# Patient Record
Sex: Female | Born: 1990 | Race: White | Hispanic: No | Marital: Single
Health system: Midwestern US, Academic
[De-identification: ages and names within clinical notes are randomized; demographics above are authoritative.]

---

## 2017-04-05 ENCOUNTER — Encounter: Admit: 2017-04-05 | Discharge: 2017-04-05 | Payer: BC Managed Care – PPO

## 2017-04-05 ENCOUNTER — Ambulatory Visit: Admit: 2017-04-05 | Discharge: 2017-04-06 | Payer: BC Managed Care – PPO

## 2017-04-05 DIAGNOSIS — F419 Anxiety disorder, unspecified: Principal | ICD-10-CM

## 2017-04-05 DIAGNOSIS — Z363 Encounter for antenatal screening for malformations: Principal | ICD-10-CM

## 2017-04-05 DIAGNOSIS — Z9089 Acquired absence of other organs: ICD-10-CM

## 2017-04-05 DIAGNOSIS — M0800 Unspecified juvenile rheumatoid arthritis of unspecified site: ICD-10-CM

## 2017-04-05 DIAGNOSIS — B009 Herpesviral infection, unspecified: ICD-10-CM

## 2017-04-05 DIAGNOSIS — Z1379 Encounter for other screening for genetic and chromosomal anomalies: ICD-10-CM

## 2017-04-05 DIAGNOSIS — O0993 Supervision of high risk pregnancy, unspecified, third trimester: Principal | ICD-10-CM

## 2017-04-05 DIAGNOSIS — G43909 Migraine, unspecified, not intractable, without status migrainosus: ICD-10-CM

## 2017-04-05 DIAGNOSIS — K319 Disease of stomach and duodenum, unspecified: ICD-10-CM

## 2017-04-09 ENCOUNTER — Encounter: Admit: 2017-04-09 | Discharge: 2017-04-09 | Payer: BC Managed Care – PPO

## 2017-04-17 ENCOUNTER — Ambulatory Visit: Admit: 2017-04-17 | Discharge: 2017-04-18 | Payer: BC Managed Care – PPO

## 2017-04-17 ENCOUNTER — Encounter: Admit: 2017-04-17 | Discharge: 2017-04-17 | Payer: BC Managed Care – PPO

## 2017-04-17 DIAGNOSIS — K319 Disease of stomach and duodenum, unspecified: ICD-10-CM

## 2017-04-17 DIAGNOSIS — F419 Anxiety disorder, unspecified: Principal | ICD-10-CM

## 2017-04-17 DIAGNOSIS — O9989 Other specified diseases and conditions complicating pregnancy, childbirth and the puerperium: ICD-10-CM

## 2017-04-17 DIAGNOSIS — O09893 Supervision of other high risk pregnancies, third trimester: ICD-10-CM

## 2017-04-17 DIAGNOSIS — M0800 Unspecified juvenile rheumatoid arthritis of unspecified site: ICD-10-CM

## 2017-04-17 DIAGNOSIS — O283 Abnormal ultrasonic finding on antenatal screening of mother: Principal | ICD-10-CM

## 2017-04-17 DIAGNOSIS — Z315 Encounter for genetic counseling: ICD-10-CM

## 2017-04-17 DIAGNOSIS — B009 Herpesviral infection, unspecified: ICD-10-CM

## 2017-04-17 DIAGNOSIS — Z9089 Acquired absence of other organs: ICD-10-CM

## 2017-04-17 DIAGNOSIS — G43909 Migraine, unspecified, not intractable, without status migrainosus: ICD-10-CM

## 2017-04-17 DIAGNOSIS — O0993 Supervision of high risk pregnancy, unspecified, third trimester: Principal | ICD-10-CM

## 2017-04-17 MED ORDER — VALACYCLOVIR 500 MG PO TAB
500 mg | ORAL_TABLET | Freq: Two times a day (BID) | ORAL | 0 refills | Status: AC
Start: 2017-04-17 — End: 2019-10-28

## 2017-04-19 ENCOUNTER — Encounter: Admit: 2017-04-19 | Discharge: 2017-04-19 | Payer: BC Managed Care – PPO

## 2017-04-24 ENCOUNTER — Encounter: Admit: 2017-04-24 | Discharge: 2017-04-24 | Payer: BC Managed Care – PPO

## 2017-04-24 DIAGNOSIS — Z1379 Encounter for other screening for genetic and chromosomal anomalies: Principal | ICD-10-CM

## 2017-04-26 LAB — SYPHILIS AB SCREEN

## 2017-04-27 ENCOUNTER — Encounter: Admit: 2017-04-27 | Discharge: 2017-04-27 | Payer: BC Managed Care – PPO

## 2017-05-01 ENCOUNTER — Ambulatory Visit: Admit: 2017-05-01 | Discharge: 2017-05-02 | Payer: BC Managed Care – PPO

## 2017-05-01 DIAGNOSIS — O359XX0 Maternal care for (suspected) fetal abnormality and damage, unspecified, not applicable or unspecified: Principal | ICD-10-CM

## 2017-05-01 DIAGNOSIS — O0993 Supervision of high risk pregnancy, unspecified, third trimester: ICD-10-CM

## 2017-05-08 ENCOUNTER — Ambulatory Visit: Admit: 2017-05-08 | Discharge: 2017-05-09 | Payer: BC Managed Care – PPO

## 2017-05-08 ENCOUNTER — Encounter: Admit: 2017-05-08 | Discharge: 2017-05-08 | Payer: BC Managed Care – PPO

## 2017-05-08 ENCOUNTER — Ambulatory Visit: Admit: 2017-05-08 | Discharge: 2017-05-08 | Payer: BC Managed Care – PPO

## 2017-05-08 ENCOUNTER — Inpatient Hospital Stay
Admit: 2017-05-08 | Discharge: 2017-05-13 | Disposition: A | Discharge status: Home / Self Care | Payer: BC Managed Care – PPO | Attending: Maternal & Fetal Medicine

## 2017-05-08 ENCOUNTER — Observation Stay: Admit: 2017-05-08 | Discharge: 2017-05-08 | Payer: BC Managed Care – PPO

## 2017-05-08 DIAGNOSIS — M0800 Unspecified juvenile rheumatoid arthritis of unspecified site: ICD-10-CM

## 2017-05-08 DIAGNOSIS — G43909 Migraine, unspecified, not intractable, without status migrainosus: ICD-10-CM

## 2017-05-08 DIAGNOSIS — O0993 Supervision of high risk pregnancy, unspecified, third trimester: ICD-10-CM

## 2017-05-08 DIAGNOSIS — F419 Anxiety disorder, unspecified: Principal | ICD-10-CM

## 2017-05-08 DIAGNOSIS — K319 Disease of stomach and duodenum, unspecified: ICD-10-CM

## 2017-05-08 DIAGNOSIS — B009 Herpesviral infection, unspecified: ICD-10-CM

## 2017-05-08 DIAGNOSIS — Z9089 Acquired absence of other organs: ICD-10-CM

## 2017-05-08 DIAGNOSIS — O09893 Supervision of other high risk pregnancies, third trimester: ICD-10-CM

## 2017-05-08 LAB — URINALYSIS DIPSTICK
Lab: NEGATIVE (ref 3–12)
Lab: NEGATIVE U/L (ref 7–40)
Lab: NEGATIVE mL/min
Lab: POSITIVE mL/min — AB

## 2017-05-08 LAB — URINALYSIS, MICROSCOPIC

## 2017-05-08 LAB — COMPREHENSIVE METABOLIC PANEL
Lab: 131 U/L — ABNORMAL HIGH (ref 25–110)
Lab: 135 MMOL/L — ABNORMAL LOW (ref 137–147)
Lab: 21 MMOL/L (ref 21–30)

## 2017-05-08 LAB — CBC: Lab: 13 10*3/uL — ABNORMAL HIGH (ref 4.5–11.0)

## 2017-05-08 LAB — PROTEIN/CR RATIO,UR RAN: Lab: 26 mg/dL — ABNORMAL LOW (ref 36–45)

## 2017-05-08 LAB — LDH-LACTATE DEHYDROGENASE: Lab: 175 U/L (ref 100–210)

## 2017-05-08 LAB — URIC ACID: Lab: 5.2 mg/dL (ref 2.0–7.0)

## 2017-05-08 MED ORDER — LACTATED RINGERS IV SOLP
INTRAVENOUS | 0 refills | Status: DC
Start: 2017-05-08 — End: 2017-05-08

## 2017-05-08 MED ORDER — VALACYCLOVIR 500 MG PO TAB
500 mg | Freq: Two times a day (BID) | ORAL | 0 refills | Status: DC
Start: 2017-05-08 — End: 2017-05-10
  Administered 2017-05-09 – 2017-05-10 (×4): 500 mg via ORAL

## 2017-05-09 ENCOUNTER — Encounter: Admit: 2017-05-09 | Discharge: 2017-05-10 | Payer: BC Managed Care – PPO

## 2017-05-09 ENCOUNTER — Encounter: Admit: 2017-05-09 | Discharge: 2017-05-09 | Payer: BC Managed Care – PPO

## 2017-05-09 DIAGNOSIS — O321XX0 Maternal care for breech presentation, not applicable or unspecified: Principal | ICD-10-CM

## 2017-05-09 DIAGNOSIS — O0993 Supervision of high risk pregnancy, unspecified, third trimester: ICD-10-CM

## 2017-05-09 LAB — COMPREHENSIVE METABOLIC PANEL
Lab: 0.4 mg/dL (ref 0.3–1.2)
Lab: 0.7 mg/dL (ref 0.4–1.00)
Lab: 126 U/L — ABNORMAL HIGH (ref 25–110)
Lab: 138 MMOL/L — ABNORMAL LOW (ref 137–147)
Lab: 14 U/L (ref 7–40)
Lab: 21 MMOL/L (ref 21–30)
Lab: 3.4 g/dL — ABNORMAL LOW (ref 3.5–5.0)
Lab: 3.6 MMOL/L — ABNORMAL LOW (ref 3.5–5.1)
Lab: 5 U/L — ABNORMAL LOW (ref 7–56)
Lab: 6.2 g/dL — ABNORMAL HIGH (ref 6.0–8.0)
Lab: 8 (ref 3–12)
Lab: 8.9 mg/dL (ref 8.5–10.6)
Lab: >60 mL/min (ref >60)
Lab: >60 mL/min (ref >60)

## 2017-05-09 LAB — CULTURE-URINE W/SENSITIVITY

## 2017-05-09 LAB — URIC ACID: Lab: 5.1 mg/dL — ABNORMAL LOW (ref 2.0–7.0)

## 2017-05-09 LAB — CBC
Lab: 31 pg (ref 26–34)
Lab: 91 FL — ABNORMAL HIGH (ref 80–100)

## 2017-05-09 LAB — TOTAL PROTEIN-URINE 24 HR: Lab: 34 mg/dL

## 2017-05-09 LAB — URINE COLLECTION
Lab: 234 mL
Lab: 24

## 2017-05-09 MED ORDER — ACETAMINOPHEN 325 MG PO TAB
650 mg | ORAL | 0 refills | Status: DC | PRN
Start: 2017-05-09 — End: 2017-05-10
  Administered 2017-05-09: 20:00:00 650 mg via ORAL

## 2017-05-09 MED ORDER — LACTATED RINGERS IV SOLP
INTRAVENOUS | 0 refills | Status: DC
Start: 2017-05-09 — End: 2017-05-10
  Administered 2017-05-10 (×2): 1000.000 mL via INTRAVENOUS

## 2017-05-10 ENCOUNTER — Encounter: Admit: 2017-05-10 | Discharge: 2017-05-10 | Payer: BC Managed Care – PPO

## 2017-05-10 ENCOUNTER — Inpatient Hospital Stay: Admit: 2017-05-10 | Discharge: 2017-05-10 | Payer: BC Managed Care – PPO

## 2017-05-10 DIAGNOSIS — Z9089 Acquired absence of other organs: ICD-10-CM

## 2017-05-10 DIAGNOSIS — G43909 Migraine, unspecified, not intractable, without status migrainosus: ICD-10-CM

## 2017-05-10 DIAGNOSIS — F419 Anxiety disorder, unspecified: Principal | ICD-10-CM

## 2017-05-10 DIAGNOSIS — K319 Disease of stomach and duodenum, unspecified: ICD-10-CM

## 2017-05-10 DIAGNOSIS — B009 Herpesviral infection, unspecified: ICD-10-CM

## 2017-05-10 DIAGNOSIS — M0800 Unspecified juvenile rheumatoid arthritis of unspecified site: ICD-10-CM

## 2017-05-10 LAB — BLOOD GASES-CORD BLOOD
Lab: 19 mmHg
Lab: 25 mmHg
Lab: 27 %
Lab: 4.2 MMOL/L
Lab: 41 mmHg
Lab: 59 mmHg
Lab: 7.2
Lab: 7.3

## 2017-05-10 LAB — URIC ACID: Lab: 4.7 mg/dL (ref 2.0–7.0)

## 2017-05-10 LAB — CREATININE CLEARANCE-URINE 24H
Lab: 0.7 mg/dL
Lab: 143 mL/min — ABNORMAL HIGH (ref 88–128)
Lab: 63 mg/dL — ABNORMAL HIGH (ref 50–150)

## 2017-05-10 MED ORDER — ONDANSETRON HCL (PF) 4 MG/2 ML IJ SOLN
4 mg | Freq: Once | INTRAVENOUS | 0 refills | Status: AC | PRN
Start: 2017-05-10 — End: ?

## 2017-05-10 MED ORDER — LACTATED RINGERS IV SOLP
INTRAVENOUS | 0 refills | Status: AC
Start: 2017-05-10 — End: ?
  Administered 2017-05-10 – 2017-05-11 (×2): 1000.000 mL via INTRAVENOUS

## 2017-05-10 MED ORDER — DIPHENHYDRAMINE HCL 50 MG/ML IJ SOLN
25 mg | Freq: Once | INTRAVENOUS | 0 refills | Status: AC | PRN
Start: 2017-05-10 — End: ?

## 2017-05-10 MED ORDER — PRENATAL VIT-IRON FUM-FOLIC AC 28 MG IRON- 800 MCG PO TAB
1 | Freq: Every evening | ORAL | 0 refills | Status: DC
Start: 2017-05-10 — End: 2017-05-13
  Administered 2017-05-11 – 2017-05-13 (×3): 1 via ORAL

## 2017-05-10 MED ORDER — SODIUM CITRATE-CITRIC ACID 500-334 MG/5 ML PO SOLN
ORAL | 0 refills | Status: DC
Start: 2017-05-10 — End: 2017-05-10
  Administered 2017-05-10: 15:00:00 30 mL via ORAL

## 2017-05-10 MED ORDER — OXYTOCIN IN 0.9 % SOD CHLORIDE 30 UNIT/500 ML IV SOLN (OR)
0 refills | Status: DC
Start: 2017-05-10 — End: 2017-05-10
  Administered 2017-05-10: 16:00:00 via INTRAVENOUS

## 2017-05-10 MED ORDER — PHENYLEPHRINE IN 0.9% NACL(PF) 1 MG/10 ML (100 MCG/ML) IV SYRG
0 refills | Status: DC
Start: 2017-05-10 — End: 2017-05-10
  Administered 2017-05-10 (×6): 100 ug via INTRAVENOUS

## 2017-05-10 MED ORDER — CEFAZOLIN 1 GRAM IJ SOLR
INTRAVENOUS | 0 refills | Status: DC
Start: 2017-05-10 — End: 2017-05-10
  Administered 2017-05-10: 15:00:00 2 g via INTRAVENOUS

## 2017-05-10 MED ORDER — PROMETHAZINE 25 MG/ML IJ SOLN
6.25 mg | INTRAVENOUS | 0 refills | Status: DC | PRN
Start: 2017-05-10 — End: 2017-05-13

## 2017-05-10 MED ORDER — ONDANSETRON HCL (PF) 4 MG/2 ML IJ SOLN
4 mg | INTRAVENOUS | 0 refills | Status: DC | PRN
Start: 2017-05-10 — End: 2017-05-13

## 2017-05-10 MED ORDER — MAGNESIUM HYDROXIDE 2,400 MG/10 ML PO SUSP
10 mL | ORAL | 0 refills | Status: DC | PRN
Start: 2017-05-10 — End: 2017-05-13
  Administered 2017-05-11: 15:00:00 10 mL via ORAL

## 2017-05-10 MED ORDER — SIMETHICONE 80 MG PO CHEW
80 mg | ORAL | 0 refills | Status: DC | PRN
Start: 2017-05-10 — End: 2017-05-13
  Administered 2017-05-11: 15:00:00 80 mg via ORAL

## 2017-05-10 MED ORDER — MISOPROSTOL 200 MCG PO TAB
800 ug | Freq: Once | RECTAL | 0 refills | Status: CP
Start: 2017-05-10 — End: ?

## 2017-05-10 MED ORDER — FAMOTIDINE 20 MG PO TAB
20 mg | Freq: Two times a day (BID) | ORAL | 0 refills | Status: DC | PRN
Start: 2017-05-10 — End: 2017-05-13

## 2017-05-10 MED ORDER — ACETAMINOPHEN 325 MG PO TAB
650 mg | ORAL | 0 refills | Status: CP
Start: 2017-05-10 — End: ?
  Administered 2017-05-10 – 2017-05-13 (×12): 650 mg via ORAL

## 2017-05-10 MED ORDER — FENTANYL CITRATE (PF) 50 MCG/ML IJ SOLN
25 ug | INTRAVENOUS | 0 refills | Status: DC | PRN
Start: 2017-05-10 — End: 2017-05-13

## 2017-05-10 MED ORDER — BUPIVACAINE (PF) 0.75 % (7.5 MG/ML) IJ SOLN
0 refills | Status: DC
Start: 2017-05-10 — End: 2017-05-10
  Administered 2017-05-10: 15:00:00 1.6 mL via INTRASPINAL

## 2017-05-10 MED ORDER — OXYTOCIN IN 0.9 % SOD CHLORIDE 30 UNIT/500 ML IV SOLN
30 [IU] | Freq: Once | INTRAVENOUS | 0 refills | Status: AC | PRN
Start: 2017-05-10 — End: ?

## 2017-05-10 MED ORDER — IBUPROFEN 600 MG PO TAB
600 mg | ORAL | 0 refills | Status: CP
Start: 2017-05-10 — End: ?
  Administered 2017-05-10 – 2017-05-13 (×12): 600 mg via ORAL

## 2017-05-10 MED ORDER — DIPHENHYDRAMINE HCL 50 MG/ML IJ SOLN
25 mg | INTRAVENOUS | 0 refills | Status: DC | PRN
Start: 2017-05-10 — End: 2017-05-10

## 2017-05-10 MED ORDER — DIPHENHYDRAMINE HCL 25 MG PO CAP
25 mg | ORAL | 0 refills | Status: DC | PRN
Start: 2017-05-10 — End: 2017-05-10
  Administered 2017-05-10: 07:00:00 25 mg via ORAL

## 2017-05-10 MED ORDER — LANOLIN TP CREA
TOPICAL | 0 refills | Status: DC | PRN
Start: 2017-05-10 — End: 2017-05-13

## 2017-05-10 MED ORDER — FAMOTIDINE (PF) 20 MG/2 ML IV SOLN
INTRAVENOUS | 0 refills | Status: DC
Start: 2017-05-10 — End: 2017-05-10
  Administered 2017-05-10: 15:00:00 20 mg via INTRAVENOUS

## 2017-05-10 MED ORDER — ONDANSETRON HCL (PF) 4 MG/2 ML IJ SOLN
4 mg | INTRAVENOUS | 0 refills | Status: DC | PRN
Start: 2017-05-10 — End: 2017-05-10

## 2017-05-10 MED ORDER — METOCLOPRAMIDE HCL 5 MG/ML IJ SOLN
INTRAVENOUS | 0 refills | Status: DC
Start: 2017-05-10 — End: 2017-05-10
  Administered 2017-05-10: 15:00:00 10 mg via INTRAVENOUS

## 2017-05-10 MED ORDER — KETAMINE 10 MG/ML IJ SOLN
0 refills | Status: DC
Start: 2017-05-10 — End: 2017-05-10
  Administered 2017-05-10: 16:00:00 30 mg via INTRAVENOUS

## 2017-05-10 MED ORDER — OXYCODONE 5 MG PO TAB
5-10 mg | ORAL | 0 refills | Status: DC | PRN
Start: 2017-05-10 — End: 2017-05-13
  Administered 2017-05-10 – 2017-05-13 (×12): 5 mg via ORAL

## 2017-05-10 MED ORDER — DIPHTH,PERTUS(ACELL),TETANUS 2.5-8-5 LF-MCG-LF/0.5ML IM SUSP
.5 mL | Freq: Once | INTRAMUSCULAR | 0 refills | Status: DC
Start: 2017-05-10 — End: 2017-05-10

## 2017-05-10 MED ORDER — LORATADINE 10 MG PO TAB
10 mg | Freq: Every day | ORAL | 0 refills | Status: DC
Start: 2017-05-10 — End: 2017-05-10

## 2017-05-10 MED ORDER — MORPHINE (PF) 1 MG/ML IJ SOLN
0 refills | Status: DC
Start: 2017-05-10 — End: 2017-05-10
  Administered 2017-05-10: 15:00:00 200 ug via INTRATHECAL

## 2017-05-10 MED ORDER — DIPHENHYDRAMINE HCL 25 MG PO CAP
25 mg | ORAL | 0 refills | Status: DC | PRN
Start: 2017-05-10 — End: 2017-05-13

## 2017-05-10 MED ORDER — FENTANYL CITRATE (PF) 50 MCG/ML IJ SOLN
0 refills | Status: DC
Start: 2017-05-10 — End: 2017-05-10
  Administered 2017-05-10: 15:00:00 10 ug via INTRATHECAL
  Administered 2017-05-10: 16:00:00 30 ug via INTRAVENOUS
  Administered 2017-05-10: 16:00:00 50 ug via INTRAVENOUS

## 2017-05-10 MED ORDER — MIDAZOLAM 1 MG/ML IJ SOLN
0 refills | Status: DC
Start: 2017-05-10 — End: 2017-05-10
  Administered 2017-05-10: 16:00:00 2 mg via INTRAVENOUS

## 2017-05-10 MED ORDER — NALOXONE 0.4 MG/ML IJ SOLN
.08 mg | INTRAVENOUS | 0 refills | Status: DC | PRN
Start: 2017-05-10 — End: 2017-05-10

## 2017-05-10 MED ORDER — OXYCODONE 5 MG PO TAB
5-10 mg | Freq: Once | ORAL | 0 refills | Status: CP | PRN
Start: 2017-05-10 — End: ?
  Administered 2017-05-10: 18:00:00 5 mg via ORAL

## 2017-05-10 MED ORDER — DOCUSATE SODIUM 100 MG PO CAP
100 mg | Freq: Two times a day (BID) | ORAL | 0 refills | Status: DC
Start: 2017-05-10 — End: 2017-05-13
  Administered 2017-05-11 – 2017-05-13 (×5): 100 mg via ORAL

## 2017-05-10 MED ADMIN — MISOPROSTOL 200 MCG PO TAB [10629]: 800 ug | RECTAL | @ 17:00:00 | Stop: 2017-05-10 | NDC 59762500802

## 2017-05-11 LAB — CULTURE-STREP SCREEN: Lab: NEGATIVE g/dL (ref 3.5–5.0)

## 2017-05-11 LAB — CBC: Lab: 3 M/UL — ABNORMAL LOW (ref >60)

## 2017-05-13 ENCOUNTER — Observation Stay: Admit: 2017-05-08 | Discharge: 2017-05-08 | Payer: BC Managed Care – PPO

## 2017-05-13 ENCOUNTER — Inpatient Hospital Stay: Admit: 2017-05-10 | Discharge: 2017-05-10 | Payer: BC Managed Care – PPO

## 2017-05-13 DIAGNOSIS — O321XX0 Maternal care for breech presentation, not applicable or unspecified: ICD-10-CM

## 2017-05-13 DIAGNOSIS — O1494 Unspecified pre-eclampsia, complicating childbirth: Principal | ICD-10-CM

## 2017-05-13 DIAGNOSIS — M0800 Unspecified juvenile rheumatoid arthritis of unspecified site: ICD-10-CM

## 2017-05-13 DIAGNOSIS — Z3A37 37 weeks gestation of pregnancy: ICD-10-CM

## 2017-05-13 MED ORDER — ACETAMINOPHEN 325 MG PO TAB
650 mg | ORAL | 0 refills | Status: DC | PRN
Start: 2017-05-13 — End: 2017-05-13
  Administered 2017-05-13: 19:00:00 650 mg via ORAL

## 2017-05-13 MED ORDER — DOCUSATE SODIUM 100 MG PO CAP
100 mg | ORAL_CAPSULE | Freq: Two times a day (BID) | ORAL | 3 refills | Status: AC
Start: 2017-05-13 — End: 2019-10-28

## 2017-05-13 MED ORDER — IBUPROFEN 600 MG PO TAB
600 mg | ORAL_TABLET | ORAL | 0 refills | Status: CN
Start: 2017-05-13 — End: ?

## 2017-05-13 MED ORDER — OXYCODONE 5 MG PO TAB
5-10 mg | ORAL_TABLET | ORAL | 0 refills | 6.00000 days | Status: AC | PRN
Start: 2017-05-13 — End: 2019-10-28

## 2017-05-13 MED ORDER — IBUPROFEN 600 MG PO TAB
600 mg | ORAL | 0 refills | Status: DC
Start: 2017-05-13 — End: 2017-05-13
  Administered 2017-05-13: 19:00:00 600 mg via ORAL

## 2017-05-14 ENCOUNTER — Encounter: Admit: 2017-05-14 | Discharge: 2017-05-14 | Payer: BC Managed Care – PPO

## 2017-05-14 DIAGNOSIS — G43909 Migraine, unspecified, not intractable, without status migrainosus: ICD-10-CM

## 2017-05-14 DIAGNOSIS — Z9089 Acquired absence of other organs: ICD-10-CM

## 2017-05-14 DIAGNOSIS — F419 Anxiety disorder, unspecified: Principal | ICD-10-CM

## 2017-05-14 DIAGNOSIS — B009 Herpesviral infection, unspecified: ICD-10-CM

## 2017-05-14 DIAGNOSIS — K319 Disease of stomach and duodenum, unspecified: ICD-10-CM

## 2017-05-14 DIAGNOSIS — M0800 Unspecified juvenile rheumatoid arthritis of unspecified site: ICD-10-CM

## 2017-05-18 ENCOUNTER — Encounter: Admit: 2017-05-18 | Discharge: 2017-05-18 | Payer: BC Managed Care – PPO

## 2017-05-22 ENCOUNTER — Encounter: Admit: 2017-05-22 | Discharge: 2017-05-22 | Payer: BC Managed Care – PPO

## 2017-06-21 ENCOUNTER — Encounter: Admit: 2017-06-21 | Discharge: 2017-06-21 | Payer: BC Managed Care – PPO

## 2017-06-21 ENCOUNTER — Ambulatory Visit: Admit: 2017-06-21 | Discharge: 2017-06-21 | Payer: BC Managed Care – PPO

## 2017-06-21 DIAGNOSIS — F419 Anxiety disorder, unspecified: Principal | ICD-10-CM

## 2017-06-21 DIAGNOSIS — K319 Disease of stomach and duodenum, unspecified: ICD-10-CM

## 2017-06-21 DIAGNOSIS — M0800 Unspecified juvenile rheumatoid arthritis of unspecified site: ICD-10-CM

## 2017-06-21 DIAGNOSIS — G43909 Migraine, unspecified, not intractable, without status migrainosus: ICD-10-CM

## 2017-06-21 DIAGNOSIS — B009 Herpesviral infection, unspecified: ICD-10-CM

## 2017-06-21 DIAGNOSIS — F321 Major depressive disorder, single episode, moderate: Principal | ICD-10-CM

## 2017-06-21 DIAGNOSIS — Z9089 Acquired absence of other organs: ICD-10-CM

## 2017-06-21 MED ORDER — SERTRALINE 100 MG PO TAB
50 mg | ORAL_TABLET | Freq: Every day | ORAL | 3 refills | Status: AC
Start: 2017-06-21 — End: 2019-10-28

## 2017-07-05 ENCOUNTER — Encounter: Admit: 2017-07-05 | Discharge: 2017-07-05 | Payer: BC Managed Care – PPO

## 2017-07-05 DIAGNOSIS — M0800 Unspecified juvenile rheumatoid arthritis of unspecified site: ICD-10-CM

## 2017-07-05 DIAGNOSIS — Z9089 Acquired absence of other organs: ICD-10-CM

## 2017-07-05 DIAGNOSIS — K319 Disease of stomach and duodenum, unspecified: ICD-10-CM

## 2017-07-05 DIAGNOSIS — F419 Anxiety disorder, unspecified: Principal | ICD-10-CM

## 2017-07-05 DIAGNOSIS — G43909 Migraine, unspecified, not intractable, without status migrainosus: ICD-10-CM

## 2017-07-05 DIAGNOSIS — B009 Herpesviral infection, unspecified: ICD-10-CM

## 2017-07-27 ENCOUNTER — Encounter: Admit: 2017-07-27 | Discharge: 2017-07-27 | Payer: BC Managed Care – PPO

## 2017-07-27 DIAGNOSIS — K319 Disease of stomach and duodenum, unspecified: ICD-10-CM

## 2017-07-27 DIAGNOSIS — M0800 Unspecified juvenile rheumatoid arthritis of unspecified site: ICD-10-CM

## 2017-07-27 DIAGNOSIS — G43909 Migraine, unspecified, not intractable, without status migrainosus: ICD-10-CM

## 2017-07-27 DIAGNOSIS — F419 Anxiety disorder, unspecified: Principal | ICD-10-CM

## 2017-07-27 DIAGNOSIS — Z9089 Acquired absence of other organs: ICD-10-CM

## 2017-07-27 DIAGNOSIS — B009 Herpesviral infection, unspecified: ICD-10-CM

## 2017-07-31 ENCOUNTER — Ambulatory Visit: Admit: 2017-07-31 | Discharge: 2017-07-31 | Payer: BC Managed Care – PPO

## 2017-07-31 ENCOUNTER — Encounter: Admit: 2017-07-31 | Discharge: 2017-07-31 | Payer: BC Managed Care – PPO

## 2017-07-31 DIAGNOSIS — F410 Panic disorder [episodic paroxysmal anxiety] without agoraphobia: ICD-10-CM

## 2017-07-31 DIAGNOSIS — F419 Anxiety disorder, unspecified: Principal | ICD-10-CM

## 2017-08-23 ENCOUNTER — Ambulatory Visit: Admit: 2017-08-23 | Discharge: 2017-08-23 | Payer: BC Managed Care – PPO

## 2017-08-23 DIAGNOSIS — F410 Panic disorder [episodic paroxysmal anxiety] without agoraphobia: ICD-10-CM

## 2017-08-23 DIAGNOSIS — Z630 Problems in relationship with spouse or partner: ICD-10-CM

## 2017-08-23 DIAGNOSIS — F419 Anxiety disorder, unspecified: Principal | ICD-10-CM

## 2017-09-05 ENCOUNTER — Ambulatory Visit: Admit: 2017-09-05 | Discharge: 2017-09-05 | Payer: BC Managed Care – PPO

## 2017-09-05 DIAGNOSIS — F410 Panic disorder [episodic paroxysmal anxiety] without agoraphobia: ICD-10-CM

## 2017-09-05 DIAGNOSIS — Z630 Problems in relationship with spouse or partner: ICD-10-CM

## 2017-09-05 DIAGNOSIS — F419 Anxiety disorder, unspecified: Principal | ICD-10-CM

## 2017-09-24 ENCOUNTER — Ambulatory Visit: Admit: 2017-09-24 | Discharge: 2017-09-25 | Payer: BC Managed Care – PPO

## 2017-09-24 DIAGNOSIS — F419 Anxiety disorder, unspecified: Principal | ICD-10-CM

## 2017-09-24 DIAGNOSIS — F410 Panic disorder [episodic paroxysmal anxiety] without agoraphobia: ICD-10-CM

## 2017-09-24 DIAGNOSIS — Z630 Problems in relationship with spouse or partner: ICD-10-CM

## 2017-10-22 ENCOUNTER — Encounter: Admit: 2017-10-22 | Discharge: 2017-10-22 | Payer: BC Managed Care – PPO

## 2017-10-25 ENCOUNTER — Encounter: Admit: 2017-10-25 | Discharge: 2017-10-25 | Payer: BC Managed Care – PPO

## 2019-08-22 IMAGING — US ABDLM
1 series · 14 of 25 positions shown · non-contrast
Comparison: none

[Series 1: us retroperitoneal limited · 14 of 42 slices shown]
[im 1/42]
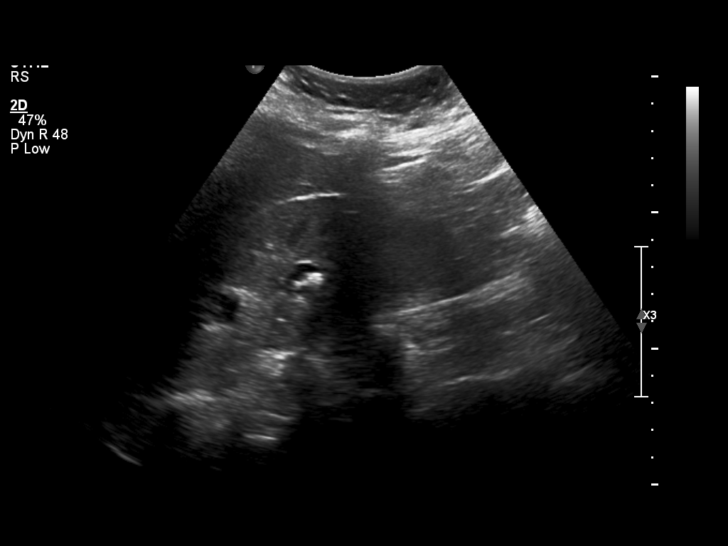
[im 4/42]
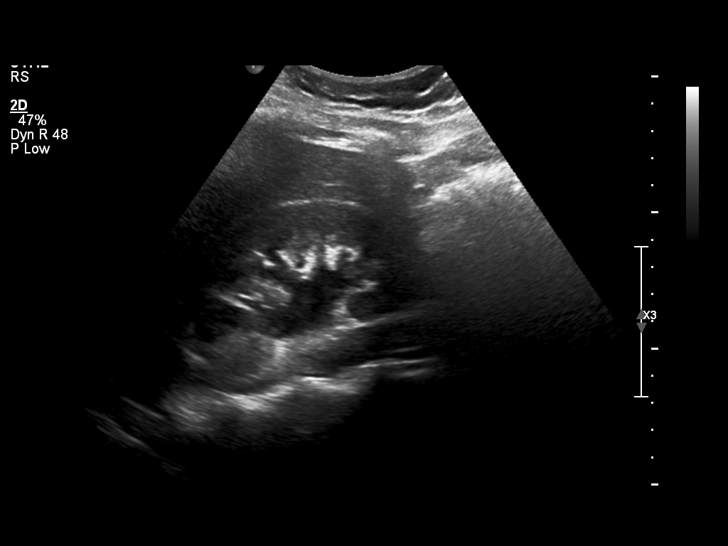
[im 7/42]
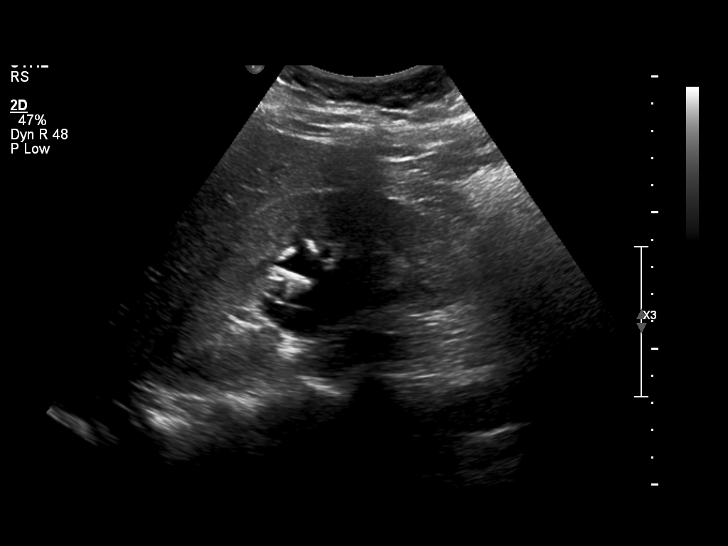
[im 11/42]
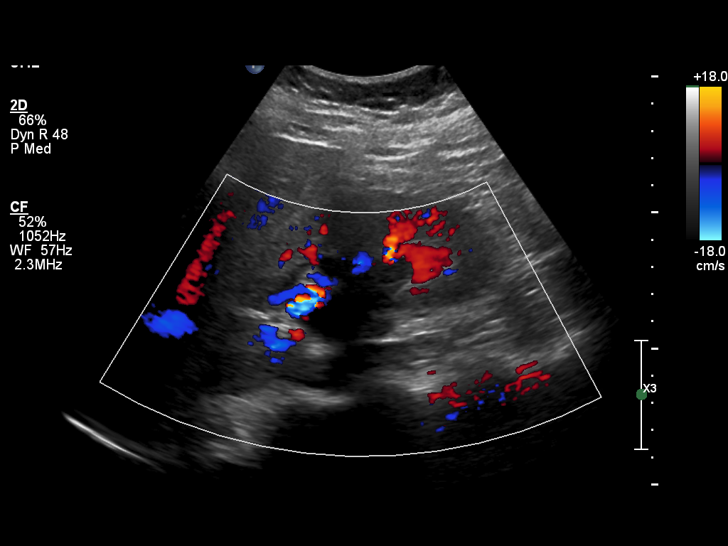
[im 14/42]
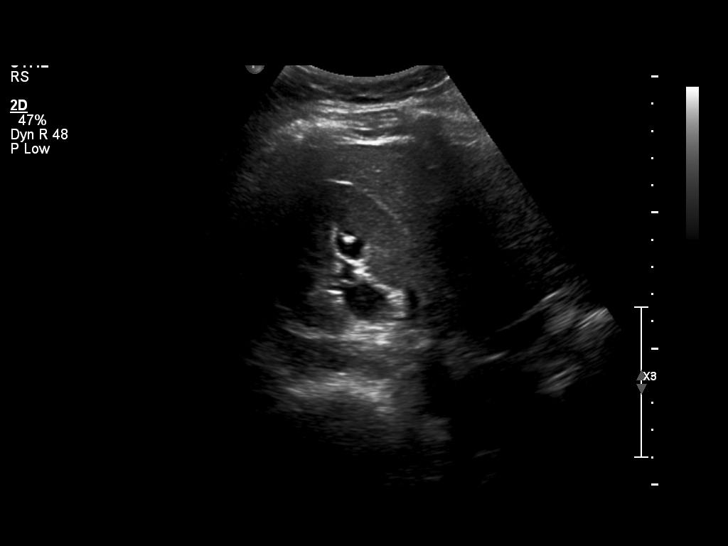
[im 16/42]
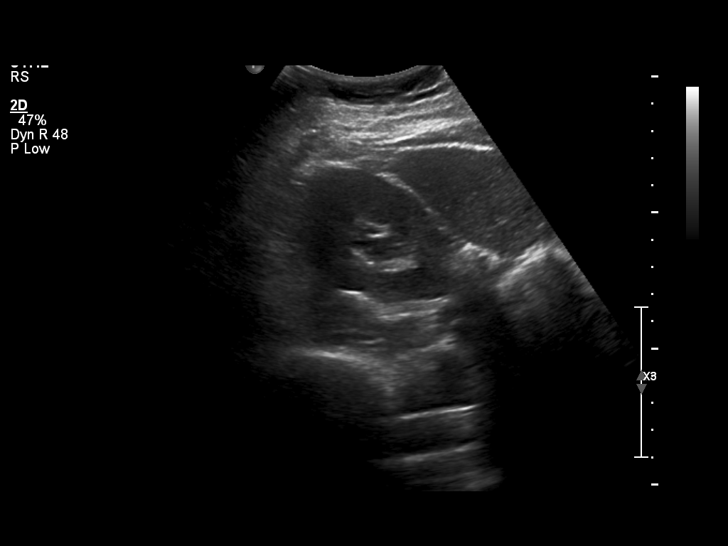
[im 19/42]
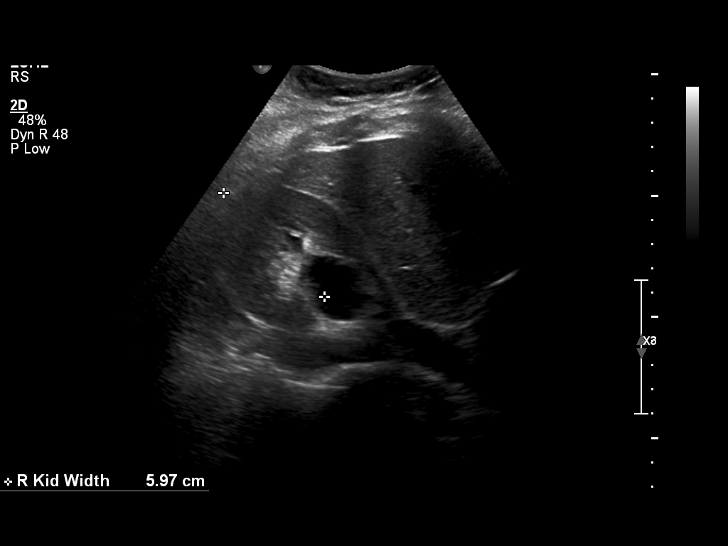
[im 23/42]
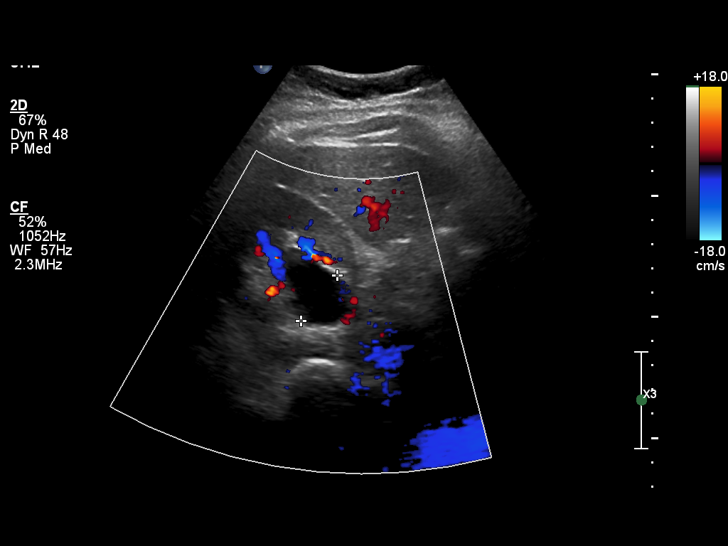
[im 26/42]
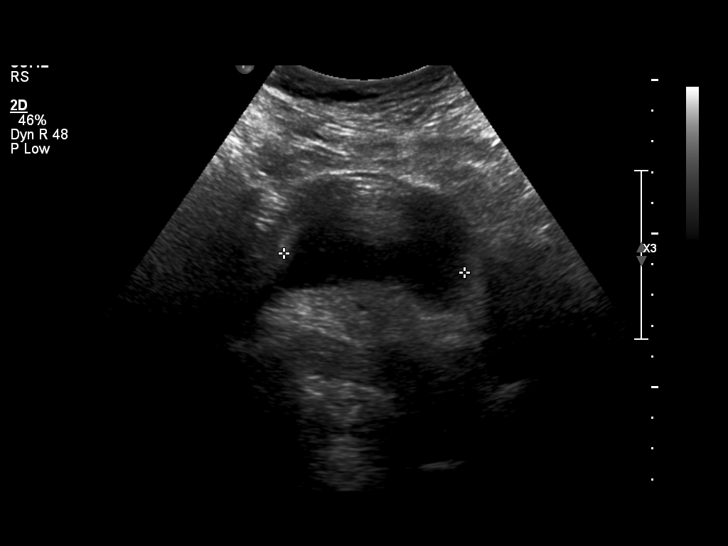
[im 28/42]
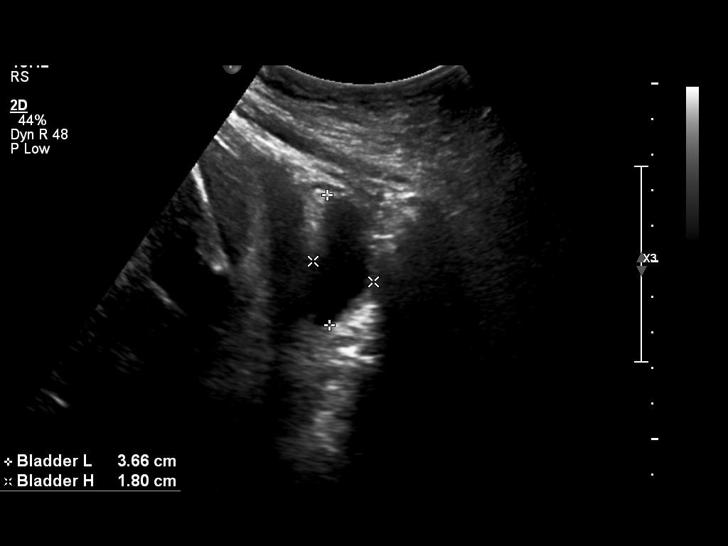
[im 31/42]
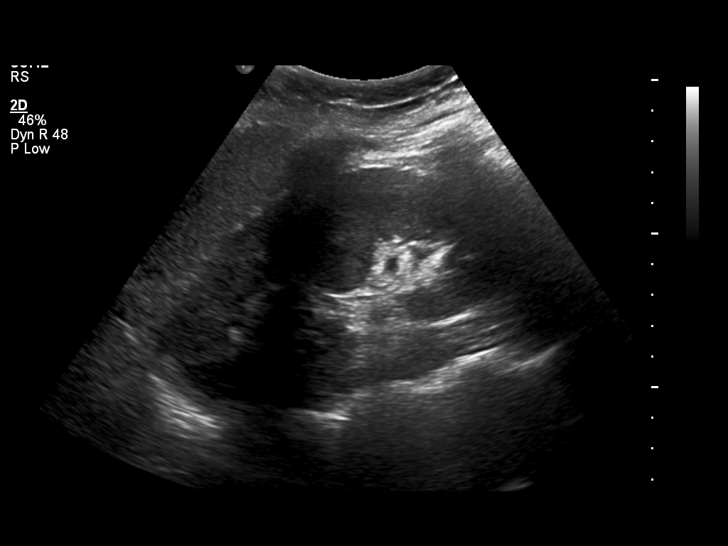
[im 35/42]
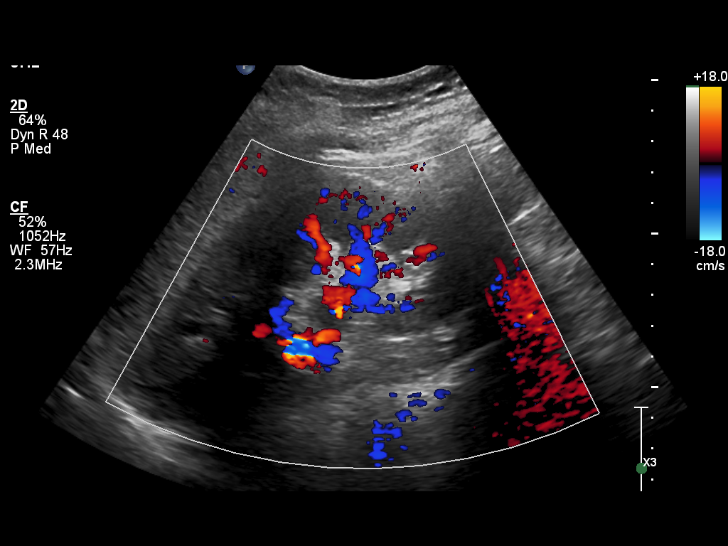
[im 38/42]
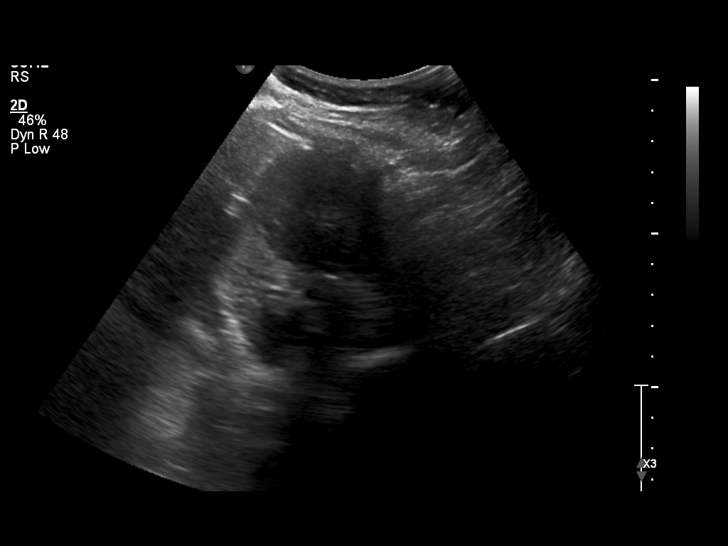
[im 42/42]
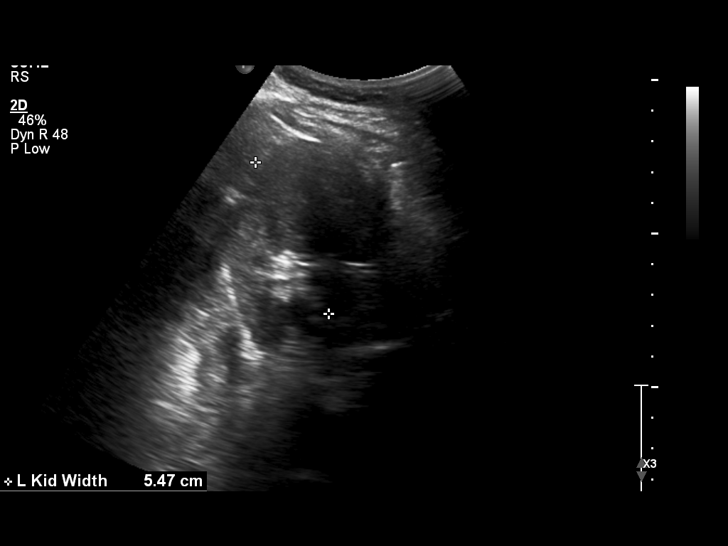

[14 of 25 positions shown; findings below may reference images not displayed]

ULTRASOUND REPORT

EXAM

Ultrasound of the kidneys.

INDICATION

Right flank pain. The patient is 28 weeks pregnant.

FINDINGS

The right kidney measures 11.5 centimeters in length. The cortex is maintained. There is mild to
greater hydronephrosis.

The left kidney measures 12.5 centimeters length. The cortex is maintained. There is no left renal
hydronephrosis.

Stones in the right kidney are identified.

IMPRESSION

Right hydronephrosis. Renal calculi are identified.

## 2019-08-27 IMAGING — US OBLATE
1 series · 13 of 16 positions shown · non-contrast
Comparison: none

[Series 1: us ob >(id) single or 1st ges · 13 of 61 slices shown]
[im 1/61]
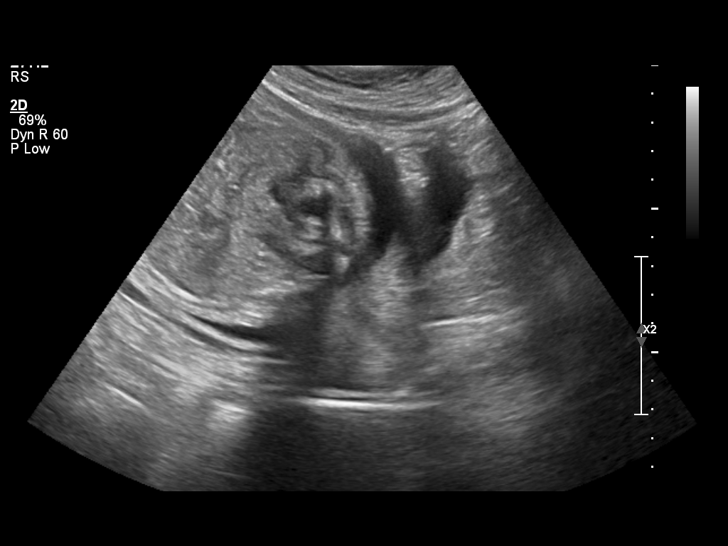
[im 5/61]
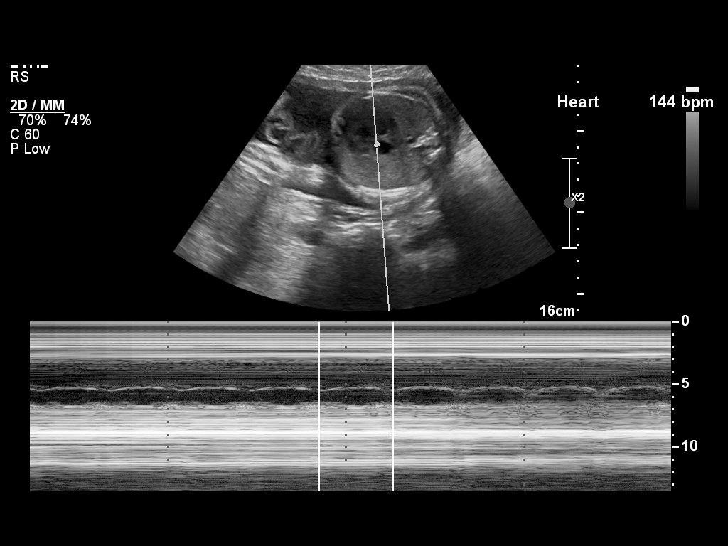
[im 13/61]
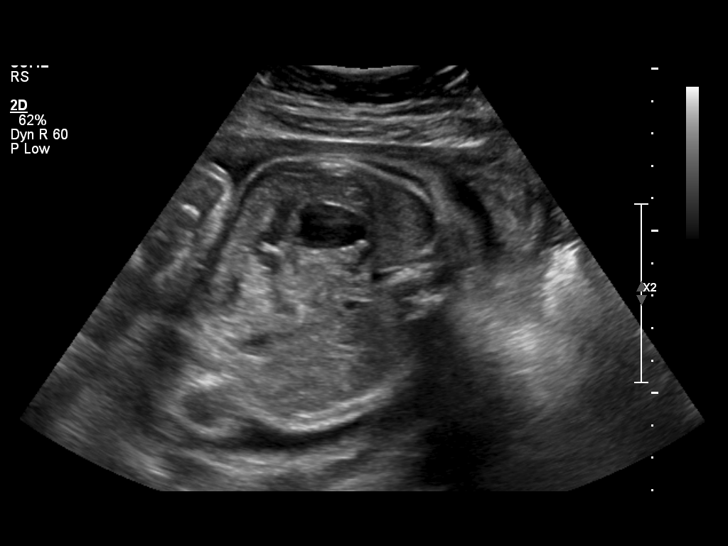
[im 17/61]
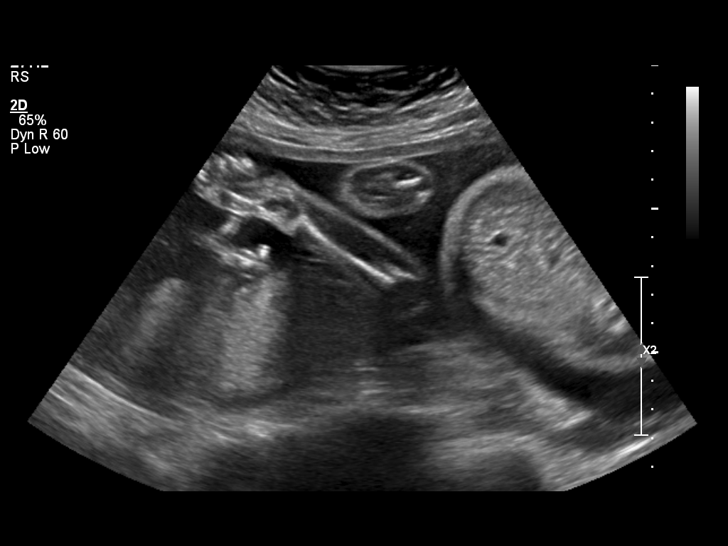
[im 21/61]
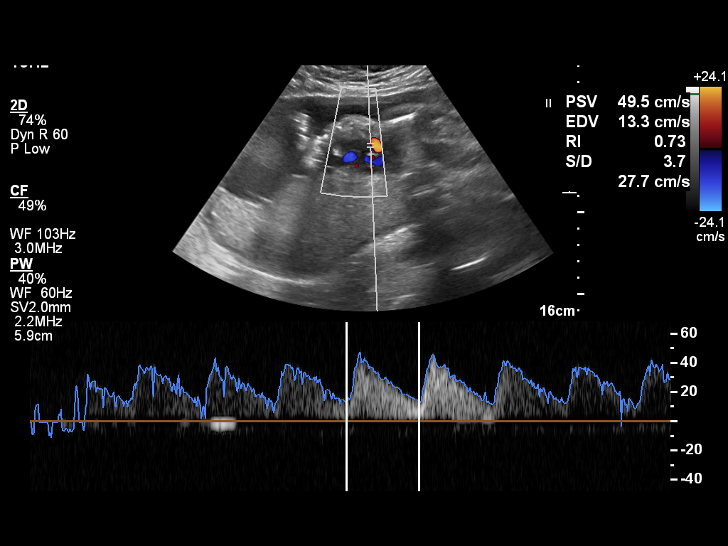
[im 25/61]
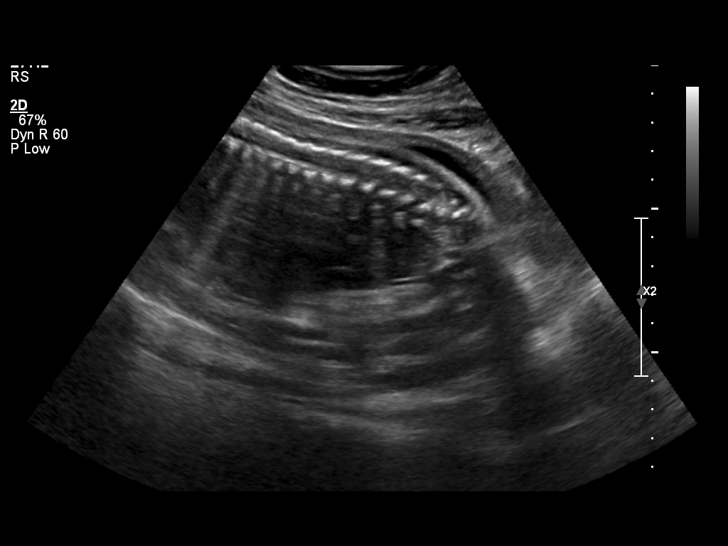
[im 33/61]
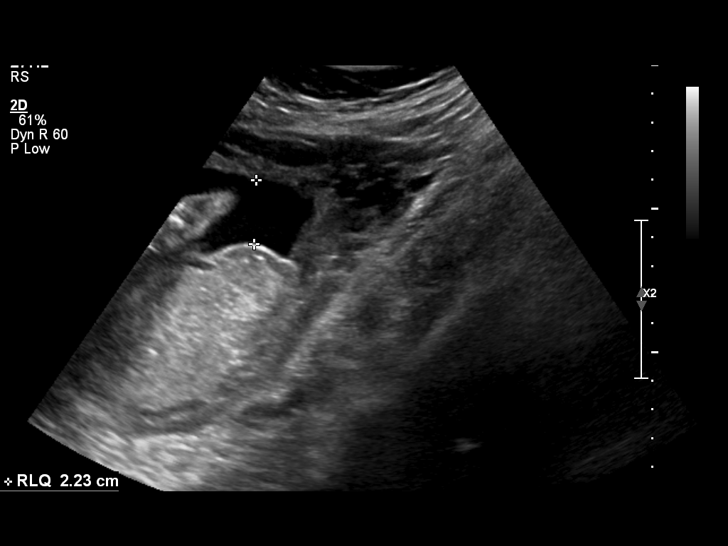
[im 37/61]
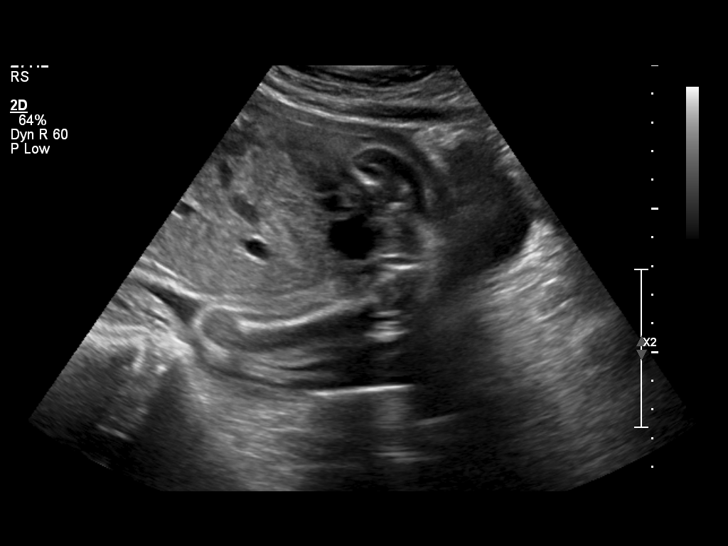
[im 41/61]
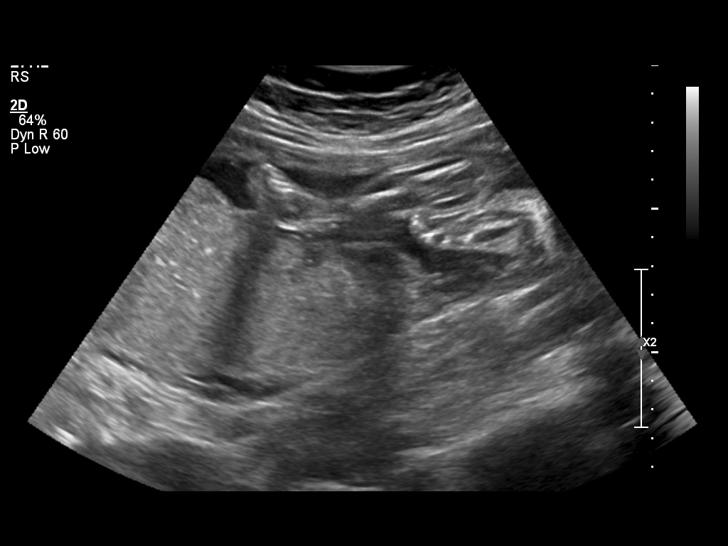
[im 45/61]
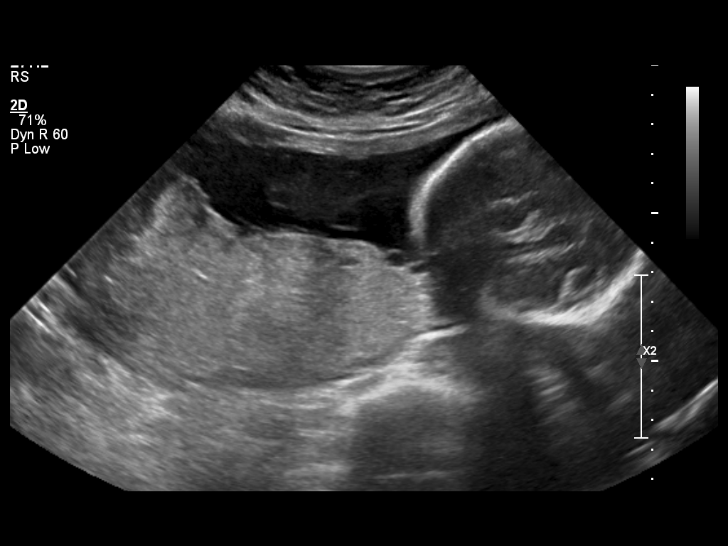
[im 49/61]
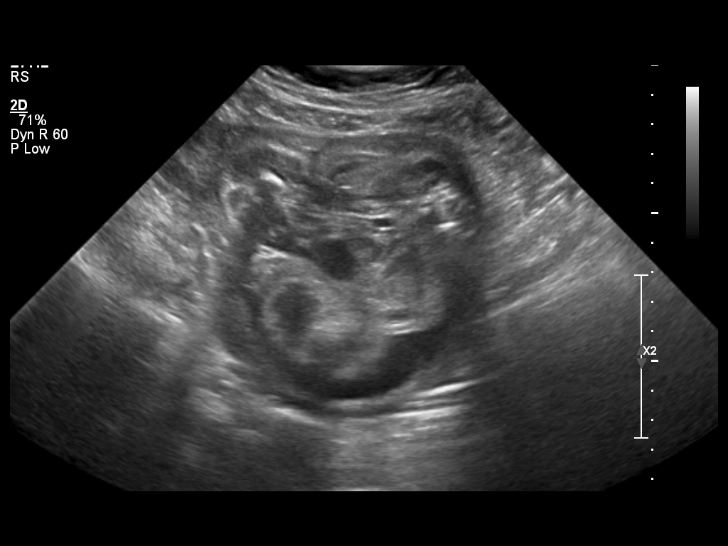
[im 57/61]
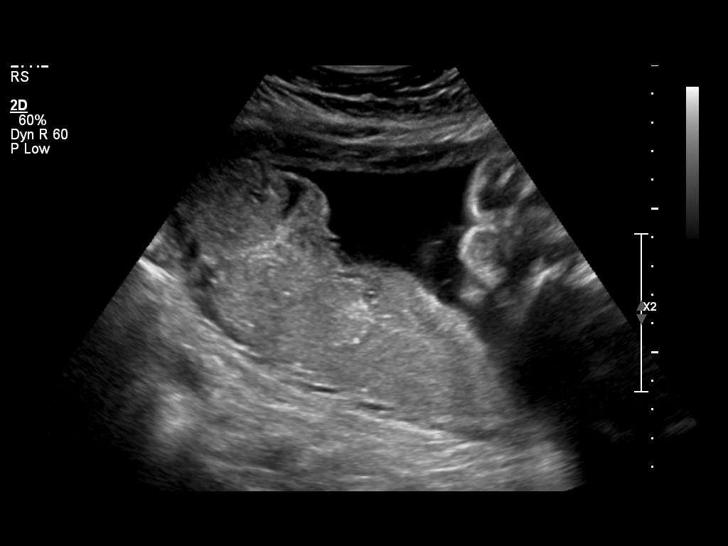
[im 61/61]
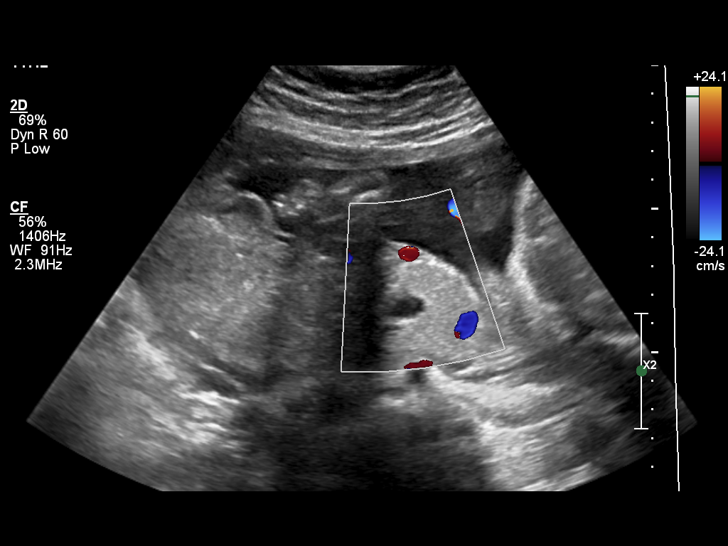

[13 of 16 positions shown; findings below may reference images not displayed]

ULTRASOUND REPORT

EXAM

ULTRASOUND, PREGNANT UTERUS, REAL TIME WITH IMAGE DOCUMENTATION, FETAL AND MATERNAL EVALUATION,

INDICATION

measuring small for dates
MEASURING SMALL FOR DATES

TECHNIQUE

Multiple static grayscale and color Doppler ultrasound images provided from a transabdominal
pelvic ultrasound.

COMPARISONS

Previous examinations dated 01/18/2017 and 03/14/2017.

FINDINGS

There is a single live intrauterine gestation in breech presentation with a fetal heart rate of
144 beats per minute. The placenta is posteriorly located and has a grade of 2. There is evidence
of a 3 vessel cord and a 4 chamber heart. There is a normal amount of amniotic fluid. AFI was
measured at 16.64 cm. The cervix appears closed with a length of 2.9 cm.

The biparietal diameter measures 7.53 cm consistent with 30 weeks and 2 days,

The head circumference measures 27.9 cm consistent with 30 weeks and 4 days,

The abdominal circumference measures 25.2 cm consistent with 29 weeks and 3 days,

And the femoral length measures 5.3 cm consistent with 28 weeks and 2 days.

By these average measurements the estimated gestational age is 29 weeks and 3 days with an
estimated fetal body weight of 3383 +/- 190 grams. This is equivalent to 2 pounds and 14 ounces +/-
7 ounces. Values fall within the 17th percentile. The estimated date of confinement by ultrasound
is 06/03/2017. The EDC based on the LMP is 05/31/2017.

Fetal anatomic survey demonstrated cardiac activity and four-chamber heart. Cervical, thoracic and
lumbo sacral spine were identified as well. Left-sided gastric bubble, extremities, fetal diaphragm
, urinary bladder and bilateral kidneys were also identified. Normal intracranial anatomy.

The head circumference/abdominal circumference ratio was measured at 1.11. Systolic/diastolic
ratio was measured at 3.7.

There were no adnexal masses. Gravid uterus appears within normal limits.

IMPRESSION

Single live intrauterine gestation with an estimated gestational age of 29 weeks and 3 days and an
estimated fetal body weight of 3383 +/- 190 grams. This is equivalent to 2 pounds and 14 ounces +/-
7 ounces. Values fall within the 17th percentile.

## 2019-10-28 ENCOUNTER — Encounter: Admit: 2019-10-28 | Discharge: 2019-10-28 | Payer: BC Managed Care – PPO

## 2019-10-28 DIAGNOSIS — F419 Anxiety disorder, unspecified: Secondary | ICD-10-CM

## 2019-10-28 DIAGNOSIS — K319 Disease of stomach and duodenum, unspecified: Secondary | ICD-10-CM

## 2019-10-28 DIAGNOSIS — Z9089 Acquired absence of other organs: Secondary | ICD-10-CM

## 2019-10-28 DIAGNOSIS — O99891 Maternal rheumatoid arthritis complicating pregnancy (HCC): Secondary | ICD-10-CM

## 2019-10-28 DIAGNOSIS — Z3682 Encounter for antenatal screening for nuchal translucency: Secondary | ICD-10-CM

## 2019-10-28 DIAGNOSIS — Z98891 History of uterine scar from previous surgery: Secondary | ICD-10-CM

## 2019-10-28 DIAGNOSIS — G43909 Migraine, unspecified, not intractable, without status migrainosus: Secondary | ICD-10-CM

## 2019-10-28 DIAGNOSIS — Z8759 Personal history of other complications of pregnancy, childbirth and the puerperium: Secondary | ICD-10-CM

## 2019-10-28 DIAGNOSIS — B009 Herpesviral infection, unspecified: Secondary | ICD-10-CM

## 2019-10-28 DIAGNOSIS — M0800 Unspecified juvenile rheumatoid arthritis of unspecified site: Secondary | ICD-10-CM

## 2019-10-28 NOTE — Progress Notes
MFM Initial Prenatal     Patient lives in [place] but wants all care [place]  ?  HPI: Melissa Hatfield is a 29 yo G2P1001 at [redacted]w[redacted]d who presents for an initial prenatal visit. Last pregnancy was managed by our HROB group due to her history of juvenile RA.  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  Pregnancy also complicated by concerns for shortened long bones but antepartum and newborn work up revealed no concerns for a skeletal dysplasia (see problem list).    ?  ?  PE:  Vitals:    10/28/19 1112   BP: 120/76   BP Source: Arm, Left Upper   Patient Position: Sitting   Pulse: 78   Weight: 82.6 kg (182 lb)   Height: 172.7 cm (68)       GEN: WNL  CV: Rate   Pulm: EWOB  Abd: soft, NT, gravid  ?    A/P:   ?  1. RA  - Has been on Humira in the past and this is the only agent that she does not have side effects with and that is effective against her disease process.  However, due to change of employment she went on a new insurance that will reimburse for her Humira but she has to pay the $3800 up front and then wait for an extended period to receive her reimbursement.  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  However, she has no significant disease activity at this time and plans to wait until after her delivery to restart the Humira.  - Primary Rheumatologist is Dr. Thressa Sheller [facility] and the [facility] in [facility]).    - We discussed that in general, up to 70% of women with rheumatoid arthritis undergo a period of remission during the course of pregnancy.??A decrease in disease activity typically starts in the first trimester and persists through the immediate postpartum period.??However, postpartum flares are very common and typically occur within the first three months after delivery.??With regards to pregnancy outcome, the general consensus is that there is no increased risk in fetal morbidity or fetal loss rate among women with rheumatoid arthritis.????  - She should continue with her Humira q 2 weeks. Previously it was recommended that newborns exposed to in-utero biologics avoid vaccines X 6 months due to concern for immunosuppression, however recent evidence suggests this is not a concern and infants can receive vaccines.   - In the event she should experience an increase in symptoms, NSAIDS can no longer be used as she is 32 weeks today.  Continue PRN use of Tylenol.??Furthermore, short courses of corticosteroids may also be helpful.??  - Pregnancy outcomes in women with well-controlled rheumatoid arthritis are comparable with those in the general population. However, women with RA (or other inflammatory arthritis) who experience a higher level of disease activity during the third trimester and/or who take glucocorticoids are at risk for small for gestational age babies and for preterm delivery.    ?  2.  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  History of LTCD due to fetal breech presentation  - Discussed risks vs. benefits of TOLAC vs. 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      3.  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    ?  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  5. Routine Pregnancy  - PNL have been drawn in [place] per Deiona's report  - These have been requested  - NT WNL this AM; elected for NIPS  - Had 274 panel carrier screening with last pregnancy and Reylynn was negative for all conditions that were screened for.  She is with a different partner this pregnancy.      Ultrasound surveillance:  Plan for 20W Detailed ultrasound then monitor fetal growth Q 4 weeks until delivery.  Initiate weekly surveillance if additional concerns arise that would potentially compromise placental function.      A total of 45 minutes was spent with regard to counseling and coordination of care, reviewing labs and/or interpreting fetal ultrasound findings that pertained to [place]'s care.      ?

## 2019-10-28 NOTE — Progress Notes
Kaniya Trueheart presents for an ultrasound encounter. Past Medical, Surgical, Family & Social History; Medications & Allergies contained in the electronic record below were not reviewed today and may not be up-to-date. Please see A/S OBGYN report for all documentation related to this encounter.    10/28/2019  Stoney Bang

## 2019-11-10 ENCOUNTER — Encounter: Admit: 2019-11-10 | Discharge: 2019-11-10 | Payer: BC Managed Care – PPO

## 2019-11-10 NOTE — Telephone Encounter
Results of non-invasive prenatal screening indicate a low risk for Trisomy 13, 18 and 21.    Chromosome Result Probability   Trisomy 21 Low risk <1 in 10,000   Trisomy 18 Low risk <1 in 10,000   Trisomy 13 Low risk <1 in 10,000   Monosomy X Low risk <1 in 10,000   Triploidy/Vanishing Twin Low risk N/A   22q11.2 deletion Low risk 1 in 9,000   Fetal Sex Female >99 percent   Fetal Fraction 8.7%      The patient was counseled that these results are limited to the determination of aneuploidy only for the chromosomes tested, and not a complete analysis of all chromosomes.  Results from this test do not eliminate the possibility that other chromosomal abnormalities may exist in this pregnancy and a negative result does not ensure an unaffected pregnancy.  While results of this testing are highly accurate, not all chromosome abnormalities may be detected due to placental, maternal or fetal mosaicism, or other causes.  If additional prenatal findings are highly suggestive of aneuploidy, amniocentesis would be recommended.    These results were conveyed to the patient.

## 2019-11-11 ENCOUNTER — Encounter: Admit: 2019-11-11 | Discharge: 2019-11-11 | Payer: BC Managed Care – PPO

## 2019-11-11 DIAGNOSIS — Z1379 Encounter for other screening for genetic and chromosomal anomalies: Secondary | ICD-10-CM

## 2019-11-25 ENCOUNTER — Encounter: Admit: 2019-11-25 | Discharge: 2019-11-25 | Payer: BC Managed Care – PPO

## 2019-11-25 DIAGNOSIS — O09292 Supervision of pregnancy with other poor reproductive or obstetric history, second trimester: Secondary | ICD-10-CM

## 2019-11-25 DIAGNOSIS — Z9089 Acquired absence of other organs: Secondary | ICD-10-CM

## 2019-11-25 DIAGNOSIS — B009 Herpesviral infection, unspecified: Secondary | ICD-10-CM

## 2019-11-25 DIAGNOSIS — G43909 Migraine, unspecified, not intractable, without status migrainosus: Secondary | ICD-10-CM

## 2019-11-25 DIAGNOSIS — F419 Anxiety disorder, unspecified: Secondary | ICD-10-CM

## 2019-11-25 DIAGNOSIS — M0800 Unspecified juvenile rheumatoid arthritis of unspecified site: Secondary | ICD-10-CM

## 2019-11-25 DIAGNOSIS — K319 Disease of stomach and duodenum, unspecified: Secondary | ICD-10-CM

## 2019-11-25 NOTE — Progress Notes
MFM Routine Prenatal Visit     Patient lives in [place] but wants all care [place]     S: Melissa Hatfield is a 29 yo G2P1001 at [redacted]w[redacted]d who presents for a prenatal visit. Last pregnancy was managed by our HROB group due to her history of juvenile RA.  She eventually delivered at [facility] at [redacted]W[redacted]D due to a diagnosis of preE without severe features which was made during her 36th week of pregnancy.  Pregnancy also complicated by concerns for shortened long bones but antepartum and newborn work up revealed no concerns for a skeletal dysplasia (see problem list).      Today, discussed COVID precautions and she had questions about the booster.  Discussed that currently it appears people will be eligible for the booster starting 8 months after they received their second dose of an mRNA vaccine - for her that will be December.       O:  BP 116/65 (BP Source: Arm, Right Upper, Patient Position: Sitting)  - Pulse 84  - Temp 36.6 ?C (97.9 ?F)  - Ht 174 cm (68.5)  - Wt 84.8 kg (187 lb)  - BMI 28.02 kg/m?    GEN: WNL  CV: Rate 84  Pulm: EWOB  Abd: soft, NT, gravid  FHT via Doppler 140    Prenatal labs [place] 09/17/19: A+/antibody neg, HIV neg, RPR neg, HBsAg neg, Hgb 13.5, Plt 245, UCx mixed flora      A/P: 29 yo G2P1001 at [redacted]w[redacted]d with rheumatoid arthritis, history of late preterm preeclampsia and CS in first pregnancy     1. RA  - Has been on Humira in the past and this is the only agent that she does not have side effects with and that is effective against her disease process. She discontinued this prior to pregnancy, however, due to cost. Now she has insurance, however currently she has no significant disease activity and plans to wait until after her delivery to restart the Humira.  - If symptoms develop, she should restart with her Humira antepartum. Previously it was recommended that newborns exposed to in-utero biologics avoid vaccines X 6 months due to concern for immunosuppression, however recent evidence suggests this is not a concern and infants can receive vaccines.   - Primary Rheumatologist is Dr. Thressa Sheller [facility] for Rhe[facility] Diseases and the Cent[facility] Immunology in [facility]).    - We discussed that in general, up to 70% of women with rheumatoid arthritis undergo a period of remission during the course of pregnancy. However, postpartum flares are very common and typically occur within the first three months after delivery.    - With regards to pregnancy outcome, the general consensus is that there is no increased risk in fetal morbidity or fetal loss rate among women with rheumatoid arthritis.      - Continue PRN use of Tylenol.  Short courses of corticosteroids may also be helpful.    - Pregnancy outcomes in women with well-controlled rheumatoid arthritis are comparable with those in the general population.     - Ultrasound surveillance:  Plan for 20W Detailed ultrasound then monitor fetal growth Q 4 weeks until delivery.  Initiate weekly surveillance if additional concerns arise that would potentially compromise placental function.       2.  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 No  random urine P:C ratio - ordered today      3.  History of LTCD due to fetal breech presentation  - Discussed risks vs. benefits of TOLAC vs. 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       4. 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    - Normal prenatal management of current pregnancy     5. HSV  - Hx of severe primary infection requiring hospitalization years ago  - No outbreaks this pregnancy  - Will Rx prophylactic valacyclovir at 35 weeks     6. Routine Pregnancy  - PNL drawn in [place] - all WNL, RI, A+/antibody neg  - Unsure if pap has been within 3 years - she will check with PCP  - NT WNL; NIPS negative  - Had 274 panel carrier screening with last pregnancy and [place] was negative for all conditions that were screened for.    - DUS scheduled next visit      RTC in 4 weeks for office visit and DUS.      Gita Kudo, MD  Maternal-Fetal Medicine

## 2019-11-25 NOTE — Progress Notes
1. Have you or your sexual partner traveled away from [place] in the last 12 weeks? No

## 2019-12-23 ENCOUNTER — Encounter: Admit: 2019-12-23 | Discharge: 2019-12-23 | Payer: BC Managed Care – PPO

## 2019-12-23 ENCOUNTER — Ambulatory Visit: Admit: 2019-12-23 | Discharge: 2019-12-24 | Payer: BC Managed Care – PPO

## 2019-12-23 DIAGNOSIS — Z8759 Personal history of other complications of pregnancy, childbirth and the puerperium: Secondary | ICD-10-CM

## 2019-12-23 DIAGNOSIS — K319 Disease of stomach and duodenum, unspecified: Secondary | ICD-10-CM

## 2019-12-23 DIAGNOSIS — O99891 Maternal rheumatoid arthritis complicating pregnancy (HCC): Secondary | ICD-10-CM

## 2019-12-23 DIAGNOSIS — F419 Anxiety disorder, unspecified: Secondary | ICD-10-CM

## 2019-12-23 DIAGNOSIS — O09299 Supervision of pregnancy with other poor reproductive or obstetric history, unspecified trimester: Secondary | ICD-10-CM

## 2019-12-23 DIAGNOSIS — G43909 Migraine, unspecified, not intractable, without status migrainosus: Secondary | ICD-10-CM

## 2019-12-23 DIAGNOSIS — B009 Herpesviral infection, unspecified: Secondary | ICD-10-CM

## 2019-12-23 DIAGNOSIS — Z9089 Acquired absence of other organs: Secondary | ICD-10-CM

## 2019-12-23 DIAGNOSIS — Z363 Encounter for antenatal screening for malformations: Secondary | ICD-10-CM

## 2019-12-23 DIAGNOSIS — M0800 Unspecified juvenile rheumatoid arthritis of unspecified site: Secondary | ICD-10-CM

## 2019-12-23 DIAGNOSIS — O0990 Supervision of high risk pregnancy, unspecified, unspecified trimester: Secondary | ICD-10-CM

## 2019-12-23 DIAGNOSIS — Z98891 History of uterine scar from previous surgery: Secondary | ICD-10-CM

## 2019-12-23 NOTE — Assessment & Plan Note
-   Discussed risks vs. benefits of TOLAC vs. 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  -12/23/2019 No questions at this time, still desires TOLAC

## 2019-12-23 NOTE — Assessment & Plan Note
-   Had short long bones and 2VC last pregnancy  -?Normal microarray (had amnio)  -?Extended carrier screening (274 panel) WNL?  - Normal exam and development after birth; had normal long bone imaging and review by Med Geneticist at [facility] who deemed there were no concerns for a skeletal dysplasia.??  - Normal prenatal management of current pregnancy

## 2019-12-23 NOTE — Assessment & Plan Note
-   Hx of severe primary infection requiring hospitalization years ago  - No outbreaks this pregnancy  - Will Rx prophylactic valacyclovir at 35 weeks

## 2019-12-23 NOTE — Progress Notes
Melissa Hatfield presents for an ultrasound encounter. Past Medical, Surgical, Family & Social History; Medications & Allergies contained in the electronic record below were not reviewed today and may not be up-to-date. Please see A/S OBGYN report for all documentation related to this encounter.    12/23/2019  Eean Buss

## 2019-12-23 NOTE — Assessment & Plan Note
-?  PNL drawn in [place] - all WNL, RI, A+/antibody neg  - Unsure if pap has been within 3 years - she will check with PCP ???  - NT WNL; NIPS negative  - Had 274 panel carrier screening with last pregnancy and Prapti was negative for all conditions that were screened for. ?  - DUS completed 12/23/2019

## 2020-01-20 ENCOUNTER — Encounter: Admit: 2020-01-20 | Discharge: 2020-01-20 | Payer: BC Managed Care – PPO

## 2020-01-20 DIAGNOSIS — G43909 Migraine, unspecified, not intractable, without status migrainosus: Secondary | ICD-10-CM

## 2020-01-20 DIAGNOSIS — K319 Disease of stomach and duodenum, unspecified: Secondary | ICD-10-CM

## 2020-01-20 DIAGNOSIS — F419 Anxiety disorder, unspecified: Secondary | ICD-10-CM

## 2020-01-20 DIAGNOSIS — O99891 Maternal rheumatoid arthritis complicating pregnancy (HCC): Secondary | ICD-10-CM

## 2020-01-20 DIAGNOSIS — B009 Herpesviral infection, unspecified: Secondary | ICD-10-CM

## 2020-01-20 DIAGNOSIS — O0990 Supervision of high risk pregnancy, unspecified, unspecified trimester: Secondary | ICD-10-CM

## 2020-01-20 DIAGNOSIS — Z9089 Acquired absence of other organs: Secondary | ICD-10-CM

## 2020-01-20 DIAGNOSIS — Z98891 History of uterine scar from previous surgery: Secondary | ICD-10-CM

## 2020-01-20 DIAGNOSIS — M0800 Unspecified juvenile rheumatoid arthritis of unspecified site: Secondary | ICD-10-CM

## 2020-01-20 DIAGNOSIS — O09299 Supervision of pregnancy with other poor reproductive or obstetric history, unspecified trimester: Secondary | ICD-10-CM

## 2020-01-20 NOTE — Progress Notes
Maternal fetal medicine follow up visit    Melissa Hatfield is a 29 y.o. G2P1001 at [redacted]w[redacted]d who presents for follow up.    Today she is overall feeling well. Denies any contractions. LOF, VB. Having some sharp shooting pains in her left lower abdomen in the pannicular crease just lateral to her C-section scar.      BP 110/58  - Pulse 91  - Ht 174 cm (68.5)  - Wt 89.7 kg (197 lb 12.8 oz)  - LMP 08/10/2019 (Exact Date)  - BMI 29.64 kg/m?    General: NAD  Abd: soft, nt, gravid (fundus at umbilicus)  Ext: no LE edema b/l  FHR: obtained by ultrasound    Today's ultrasound:  EFW 588g (46%-ile) with AC at the 49%-ile.      A/P    29 y.o. G2P1001 with IUP at [redacted]W[redacted]D    Maternal rheumatoid arthritis complicating pregnancy (HCC)  - Has been on Humira in the past and this is the only agent that she does not have side effects with and that is effective against her disease process. She discontinued this prior to pregnancy, however, due to cost.?Now she has insurance, however currently she has no significant disease activity and plans to wait until after her delivery to restart the Humira.  - If symptoms develop, she should restart with her Humira antepartum. Previously it was recommended that newborns exposed to in-utero biologics avoid vaccines X 6 months due to concern for immunosuppression, however recent evidence suggests this is not a concern and infants can receive vaccines.   - Primary Rheumatologist is Dr. Thressa Hatfield [facility] for Rheumatic Diseases and the Cent[facility] Immunology in [facility]).???  - Continue PRN?use of Tylenol.??Short courses of corticosteroids may also be helpful.??  - Pregnancy outcomes in women with well-controlled rheumatoid arthritis are comparable with those in the general population.  ?  - Ultrasound surveillance:  monitor fetal growth Q 4 weeks until delivery. ?Initiate weekly surveillance if additional concerns arise that would potentially compromise placental function. ?  -01/20/2020 Growth ultrasound completed today demonstrates normal growth..  - Not requiring any medication, she is following with her rheumatologist. Has close follow up postpartum with rheum. Plans to remain off Humira until after delivery at this point  -Aware of risk for flare postpartum    History of fetal abnormality in previous pregnancy, currently pregnant  - Had short long bones and 2VC last pregnancy  -?Normal microarray (had amnio)  -?Extended carrier screening (274 panel) WNL?  - Normal exam and development after birth; had normal long bone imaging and review by Med Geneticist at [facility] who deemed there were no concerns for a skeletal dysplasia.??  - Normal prenatal management of current pregnancy    History of C-section  - Discussed risks vs. benefits of TOLAC vs. 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  -01/20/2020 No questions at this time, still desires TOLAC    Supervision of high risk pregnancy, antepartum  -?PNL drawn in [place] - all WNL, RI, A+/antibody neg  - Pap smear with primary in [place]  - NT WNL; NIPS negative  - Had 274 panel carrier screening with last pregnancy and Melissa Hatfield was negative for all conditions that were screened for. ?  -Plan for Tdap in ~6W  -Flu Vaccine: desires but does not want at today's appointment.    -Lab requisitions for third trimester labs given to patient.  1hr GCT performed since  she will have this performed at [facility].        Melissa Heman, DO     A total of 30 minutes was spent preparing to see the patient on this date, obtaining and/or reviewing separately obtained history, performing a medically appropriate examination and/or evaluation, counseling and educating the patient/family/caregiver, documenting clinical information in the electronic or other health record and/or interpreting fetal ultrasound findings that pertained to [place]'s care.

## 2020-01-20 NOTE — Progress Notes
Melissa Hatfield presents for an ultrasound encounter. Past Medical, Surgical, Family & Social History; Medications & Allergies contained in the electronic record below were not reviewed today and may not be up-to-date. Please see A/S OBGYN report for all documentation related to this encounter.    01/20/2020  Melissa Hatfield

## 2020-01-20 NOTE — Assessment & Plan Note
-   Has been on Humira in the past and this is the only agent that she does not have side effects with and that is effective against her disease process. She discontinued this prior to pregnancy, however, due to cost.?Now she has insurance, however currently she has no significant disease activity and plans to wait until after her delivery to restart the Humira.  - If symptoms develop, she should restart with her Humira antepartum. Previously it was recommended that newborns exposed to in-utero biologics avoid vaccines X 6 months due to concern for immunosuppression, however recent evidence suggests this is not a concern and infants can receive vaccines.   - Primary Rheumatologist is Dr. Thressa Sheller [facility] for Rheumatic Diseases and the Cent[facility] for All[facility] and Immunology in [facility]).???  - Continue PRN?use of Tylenol.??Short courses of corticosteroids may also be helpful.??  - Pregnancy outcomes in women with well-controlled rheumatoid arthritis are comparable with those in the general population.  ?  - Ultrasound surveillance:  monitor fetal growth Q 4 weeks until delivery. ?Initiate weekly surveillance if additional concerns arise that would potentially compromise placental function. ?  -01/20/2020 Growth ultrasound completed today demonstrates normal growth..  - Not requiring any medication, she is following with her rheumatologist. Has close follow up postpartum with rheum. Plans to remain off Humira until after delivery at this point  -Aware of risk for flare postpartum

## 2020-01-20 NOTE — Assessment & Plan Note
-   Discussed risks vs. benefits of TOLAC vs. 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  -01/20/2020 No questions at this time, still desires TOLAC

## 2020-01-20 NOTE — Assessment & Plan Note
-   Had short long bones and 2VC last pregnancy  -?Normal microarray (had amnio)  -?Extended carrier screening (274 panel) WNL?  - Normal exam and development after birth; had normal long bone imaging and review by Med Geneticist at [facility] who deemed there were no concerns for a skeletal dysplasia.??  - Normal prenatal management of current pregnancy

## 2020-02-17 ENCOUNTER — Encounter: Admit: 2020-02-17 | Discharge: 2020-02-17 | Payer: BC Managed Care – PPO

## 2020-02-17 DIAGNOSIS — Z98891 History of uterine scar from previous surgery: Secondary | ICD-10-CM

## 2020-02-17 DIAGNOSIS — O99891 Maternal rheumatoid arthritis complicating pregnancy (HCC): Secondary | ICD-10-CM

## 2020-02-17 DIAGNOSIS — Z8759 Personal history of other complications of pregnancy, childbirth and the puerperium: Secondary | ICD-10-CM

## 2020-02-17 DIAGNOSIS — B009 Herpesviral infection, unspecified: Secondary | ICD-10-CM

## 2020-02-17 DIAGNOSIS — O09299 Supervision of pregnancy with other poor reproductive or obstetric history, unspecified trimester: Secondary | ICD-10-CM

## 2020-02-17 DIAGNOSIS — O0990 Supervision of high risk pregnancy, unspecified, unspecified trimester: Secondary | ICD-10-CM

## 2020-02-24 ENCOUNTER — Encounter: Admit: 2020-02-24 | Discharge: 2020-02-24 | Payer: BC Managed Care – PPO

## 2020-02-24 NOTE — Telephone Encounter
Received results of 1-hour GTT =  144   Abnormal lab result, per Dr. Nedra Hai patient will need a Fasting 3-hr GTT. Attempted to contact patient. Not authorized to leave detailed voicemail. LVM requesting call-back to [facility] nurse. Awaiting patient call-back.

## 2020-03-02 ENCOUNTER — Encounter: Admit: 2020-03-02 | Discharge: 2020-03-02 | Payer: BC Managed Care – PPO

## 2020-03-02 DIAGNOSIS — O0990 Supervision of high risk pregnancy, unspecified, unspecified trimester: Secondary | ICD-10-CM

## 2020-03-02 DIAGNOSIS — Z98891 History of uterine scar from previous surgery: Secondary | ICD-10-CM

## 2020-03-02 DIAGNOSIS — G43909 Migraine, unspecified, not intractable, without status migrainosus: Secondary | ICD-10-CM

## 2020-03-02 DIAGNOSIS — K319 Disease of stomach and duodenum, unspecified: Secondary | ICD-10-CM

## 2020-03-02 DIAGNOSIS — M0800 Unspecified juvenile rheumatoid arthritis of unspecified site: Secondary | ICD-10-CM

## 2020-03-02 DIAGNOSIS — B009 Herpesviral infection, unspecified: Secondary | ICD-10-CM

## 2020-03-02 DIAGNOSIS — O09299 Supervision of pregnancy with other poor reproductive or obstetric history, unspecified trimester: Secondary | ICD-10-CM

## 2020-03-02 DIAGNOSIS — Z9089 Acquired absence of other organs: Secondary | ICD-10-CM

## 2020-03-02 DIAGNOSIS — R222 Localized swelling, mass and lump, trunk: Secondary | ICD-10-CM

## 2020-03-02 DIAGNOSIS — O99891 Maternal rheumatoid arthritis complicating pregnancy (HCC): Secondary | ICD-10-CM

## 2020-03-02 DIAGNOSIS — Z8759 Personal history of other complications of pregnancy, childbirth and the puerperium: Secondary | ICD-10-CM

## 2020-03-02 DIAGNOSIS — M940 Chondrocostal junction syndrome [Tietze]: Secondary | ICD-10-CM

## 2020-03-02 DIAGNOSIS — F419 Anxiety disorder, unspecified: Secondary | ICD-10-CM

## 2020-03-02 MED ORDER — OXYCODONE-ACETAMINOPHEN 5-325 MG PO TAB
1 | ORAL_TABLET | ORAL | 0 refills | 2.00000 days | Status: AC | PRN
Start: 2020-03-02 — End: ?

## 2020-03-02 NOTE — Assessment & Plan Note
   Normal anatomy this pregnancy. NIPS wnl.

## 2020-03-02 NOTE — Assessment & Plan Note
   Desires TOLAC.

## 2020-03-02 NOTE — Assessment & Plan Note
   No new symptoms.  Continue expt mgmt off biologics.

## 2020-03-02 NOTE — Assessment & Plan Note
   Normotensive. Continue aspirin 162 mg daily.    Preeclampsia precautions discussed

## 2020-03-02 NOTE — Progress Notes
Maternal fetal medicine follow up visit    Melissa Hatfield is a 29 y.o. G2P1001 at [redacted]w[redacted]d who presents for follow up.    S: Denies contractions, vaginal bleeding or LOF.  Had third trimester labs after last visit.  Labs reviewed with Melissa Hatfield at today's visit.  For the last 2- 3weeks has noticed swelling and a mass like sensation along her lower rib cage.  This observation has been accompanied by a constant pain in the area that has occasional episodes of pain that shoots down into her mid abdomen and upper back.  Denies dysuria or hematuria.  Has had renal stones in the past but this is completely different.      Reports good FM.        BP 108/66  - Pulse 95  - Temp 36.1 ?C (97 ?F)  - Ht 174 cm (68.5)  - Wt 92.6 kg (204 lb 3.2 oz)  - LMP 08/10/2019 (Exact Date)  - BMI 30.60 kg/m?      Vitals:    03/02/20 1040   BP: 108/66   Pulse: 95   Temp: 36.1 ?C (97 ?F)   Weight: 92.6 kg (204 lb 3.2 oz)   Height: 174 cm (68.5)         General: uncomfortable sitting up straight  Abd/chest exam:  swelling vs. mass within the intercostal space located between the 9th and 10th rib at the mid-clavicular zone on the RIGHT.  Assymmetric when compared to the same zone on the left.  No RUQ pain elicited on exam.  No skin discoloration or lesions appreciated.  Abd: soft, nt, gravid (fundus at umbilicus)  Ext: no LE edema b/l  FHR: 135-140    SONO  12/23/19 [redacted]w[redacted]d, EFW 37%, limited views of heart/diaphragm, Low-lying placenta  01/20/20 [redacted]w[redacted]d, EFW 46%, normal anatomy  02/17/20 [redacted]w[redacted]d, EFW 34%, AFI 15 cm.  Low lying placenta resolved    1hr GCT results:  144    A/P    29 y.o. G2P1001 with IUP at [redacted]w[redacted]d    Mass vs. swelling mid-clavicular region in the intercostal space between ribs 9 and 10  ? Radiology requisition faxed to [facility]  ? Patient given phone number to call to make an appointment  ? If no mass observed then I will treat as a costochondritis.    ? Stretching exercise sheet given to patient  ? Instructed on heat and ice therapy  ? E-scribed Rx for Percocet 325/5 to her pharmacy (#12 prescribed).      Supervision of high risk pregnancy, antepartum  ? Discussed failure of 1hr GCT  ? Discussed 3hr GTT.  Due to time constraints, discussed 2hr GTT.    ? Lab requisition given.  Labs to be drawn in [place].    ? Flu Vaccine given 02/17/20  ? Needs Tdap    Maternal rheumatoid arthritis complicating pregnancy (HCC)  ? No new symptoms.  Continue expt mgmt off biologics.     History of pre-eclampsia  ? Normotensive. Continue aspirin 162 mg daily.   ? Preeclampsia precautions discussed    History of C-section  ? Desires TOLAC.     History of fetal abnormality in previous pregnancy, currently pregnant  ? Normal anatomy this pregnancy. NIPS wnl.       A total of 60 minutes was spent preparing to see the patient on this date, obtaining and/or reviewing separately obtained history, performing a medically appropriate examination and/or evaluation, counseling and educating the patient/family/caregiver, documenting clinical information in  the electronic or other health record and/or interpreting fetal ultrasound findings that pertained to Melissa Hatfield's care.              [name], DO     A total of 30 minutes was spent preparing to see the patient on this date, obtaining and/or reviewing separately obtained history, performing a medically appropriate examination and/or evaluation, counseling and educating the patient/family/caregiver, documenting clinical information in the electronic or other health record and/or interpreting fetal ultrasound findings that pertained to Melissa Hatfield's care.

## 2020-03-03 ENCOUNTER — Encounter: Admit: 2020-03-03 | Discharge: 2020-03-03 | Payer: BC Managed Care – PPO

## 2020-03-09 ENCOUNTER — Encounter: Admit: 2020-03-09 | Discharge: 2020-03-09 | Payer: BC Managed Care – PPO

## 2020-03-09 DIAGNOSIS — O0990 Supervision of high risk pregnancy, unspecified, unspecified trimester: Secondary | ICD-10-CM

## 2020-03-09 DIAGNOSIS — O09299 Supervision of pregnancy with other poor reproductive or obstetric history, unspecified trimester: Secondary | ICD-10-CM

## 2020-03-09 DIAGNOSIS — B009 Herpesviral infection, unspecified: Secondary | ICD-10-CM

## 2020-03-09 NOTE — Telephone Encounter
Dr. Ann Maki reviewed [facility] imaging of chest. No masses seen. Attempted to contact patient. No Answer. LVM on identified line. Encouraged to call back with any questions or concerns.

## 2020-03-16 ENCOUNTER — Encounter: Admit: 2020-03-16 | Discharge: 2020-03-16 | Payer: BC Managed Care – PPO

## 2020-03-16 DIAGNOSIS — Z9089 Acquired absence of other organs: Secondary | ICD-10-CM

## 2020-03-16 DIAGNOSIS — O99343 Other mental disorders complicating pregnancy, third trimester: Secondary | ICD-10-CM

## 2020-03-16 DIAGNOSIS — B009 Herpesviral infection, unspecified: Secondary | ICD-10-CM

## 2020-03-16 DIAGNOSIS — O99891 Maternal rheumatoid arthritis complicating pregnancy (HCC): Secondary | ICD-10-CM

## 2020-03-16 DIAGNOSIS — Z8759 Personal history of other complications of pregnancy, childbirth and the puerperium: Secondary | ICD-10-CM

## 2020-03-16 DIAGNOSIS — K319 Disease of stomach and duodenum, unspecified: Secondary | ICD-10-CM

## 2020-03-16 DIAGNOSIS — O0990 Supervision of high risk pregnancy, unspecified, unspecified trimester: Secondary | ICD-10-CM

## 2020-03-16 DIAGNOSIS — F419 Anxiety disorder, unspecified: Secondary | ICD-10-CM

## 2020-03-16 DIAGNOSIS — Z98891 History of uterine scar from previous surgery: Secondary | ICD-10-CM

## 2020-03-16 DIAGNOSIS — O09299 Supervision of pregnancy with other poor reproductive or obstetric history, unspecified trimester: Secondary | ICD-10-CM

## 2020-03-16 DIAGNOSIS — G43909 Migraine, unspecified, not intractable, without status migrainosus: Secondary | ICD-10-CM

## 2020-03-16 DIAGNOSIS — M0800 Unspecified juvenile rheumatoid arthritis of unspecified site: Secondary | ICD-10-CM

## 2020-03-16 NOTE — Assessment & Plan Note
Prophylactic valacyclovir at 35 weeks.

## 2020-03-16 NOTE — Assessment & Plan Note
Desires TOLAC.

## 2020-03-16 NOTE — Assessment & Plan Note
Normal anatomy this pregnancy  NIPS wnl.

## 2020-03-16 NOTE — Progress Notes
Maternal fetal medicine follow up visit    Melissa Hatfield is a 29 y.o. G2P1001 at [redacted]w[redacted]d who presents for follow up.    S: admits that depression is worse due to constant pain with costocondritis in right chest wall.  Has tried lidocaine jelly, but only temporary relief.  Used percocet occasionally for sleep, but only gets effect for several hours.  Denies suicide ideation or homicide ideation. Open to meeting with Dr. Bertram Millard, especially virtually.    She accepts her Tdap today. Reports COVID19 vaccine previously in march, both doses.   She desires a BTL, will sign T19 today.      Reports good FM.  Neg ctx, neg rom, neg vb.      BP 121/65  - Pulse 88  - Ht 174 cm (68.5)  - Wt 93.6 kg (206 lb 6.4 oz)  - LMP 08/10/2019 (Exact Date)  - BMI 30.93 kg/m?      Vitals:    03/16/20 1304   BP: 121/65   Pulse: 88   Weight: 93.6 kg (206 lb 6.4 oz)   Height: 174 cm (68.5)         General:   Abd/chest exam:  Still tender over right inferior rib cage, just immediately lateral to xiphoid.   Abd: soft, nt, gravid (fundus at umbilicus)  Ext: no LE edema b/l      SONO  12/23/19 [redacted]w[redacted]d, EFW 37%, limited views of heart/diaphragm, Low-lying placenta  01/20/20 [redacted]w[redacted]d, EFW 46%, normal anatomy  02/17/20 [redacted]w[redacted]d, EFW 34%, AFI 15 cm.  Low lying placenta resolved  03/16/20 [redacted]w[redacted]d, EFW 76%, AC 81%, AFI 18 cm    Labs:  02/21/20 RPR neg, Hgb 10.7, plt 239, TSH 0.9, ab screen neg,  03/04/20 2h GTT, 82/127/102     A/P    29 y.o. G2P1001 with IUP at [redacted]w[redacted]d    Mass vs. swelling mid-clavicular region in the intercostal space between ribs 9 and 10  ? 03/14/20 CXR neg for mass  ? If no mass observed then I will treat as a costochondritis.    ? Stretching exercise sheet given to patient  ? Instructed on heat and ice therapy  ? E-scribed Rx for Percocet 325/5 to her pharmacy (#12 prescribed).   ? 03/16/20 will try voltaren gel.  Next step may need to be gabapentin for neuropathic pain.      Maternal rheumatoid arthritis complicating pregnancy (HCC)  No new symptoms. Remains off biologics for now.     Supervision of high risk pregnancy, antepartum  Tdap 03/16/20.   S/p COVID19 vaccine 06/2019 2 courses  BCM - desires BTL, signed T19 form 03/16/20.   Next visit 2 weeks.     History of pre-eclampsia  BP 121/65 today.  Continue aspirin 162 mg daily      History of C-section  Desires TOLAC.    History of fetal abnormality in previous pregnancy, currently pregnant  Normal anatomy this pregnancy  NIPS wnl.    HSV infection  Prophylactic valacyclovir at 35 weeks.     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  Referred to Dr. Catha Nottingham today.       A total of 40 minutes was spent preparing to see the patient on this date, obtaining and/or reviewing separately obtained history, performing a medically appropriate examination and/or evaluation, counseling and educating the patient/family/caregiver, documenting clinical information in the electronic or other health record and/or interpreting fetal ultrasound findings that pertained to Caron's care.  Algernon Huxley, MD

## 2020-03-16 NOTE — Progress Notes
Melissa Hatfield presents for an ultrasound encounter. Past Medical, Surgical, Family & Social History; Medications & Allergies contained in the electronic record below were not reviewed today and may not be up-to-date. Please see A/S OBGYN report for all documentation related to this encounter.    03/16/2020  Ikey Omary

## 2020-03-16 NOTE — Assessment & Plan Note
BP 121/65 today.  Continue aspirin 162 mg daily

## 2020-03-18 ENCOUNTER — Encounter: Admit: 2020-03-18 | Discharge: 2020-03-18 | Payer: BC Managed Care – PPO

## 2020-03-18 ENCOUNTER — Ambulatory Visit: Admit: 2020-03-18 | Discharge: 2020-03-19 | Payer: BC Managed Care – PPO

## 2020-03-18 DIAGNOSIS — F439 Reaction to severe stress, unspecified: Secondary | ICD-10-CM

## 2020-03-18 DIAGNOSIS — O99340 Other mental disorders complicating pregnancy, unspecified trimester: Secondary | ICD-10-CM

## 2020-03-18 MED ORDER — CITALOPRAM 40 MG PO TAB
40 mg | ORAL_TABLET | Freq: Every day | ORAL | 0 refills | Status: AC
Start: 2020-03-18 — End: ?

## 2020-03-18 MED ORDER — CITALOPRAM 10 MG PO TAB
10 mg | ORAL_TABLET | Freq: Every day | ORAL | 0 refills | Status: AC
Start: 2020-03-18 — End: ?
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
## 2020-03-19 DIAGNOSIS — O99343 Other mental disorders complicating pregnancy, third trimester: Secondary | ICD-10-CM

## 2020-03-19 DIAGNOSIS — F32A Depression, unspecified: Secondary | ICD-10-CM

## 2020-03-29 ENCOUNTER — Encounter: Admit: 2020-03-29 | Discharge: 2020-03-29 | Payer: BC Managed Care – PPO

## 2020-03-29 NOTE — Telephone Encounter
Patient called in asking to cancel her appointment for this week. She was around her mom who had COVID and is now feeling ill. Is locating a test in her area. Will reschedule for next week.

## 2020-04-02 ENCOUNTER — Encounter: Admit: 2020-04-02 | Discharge: 2020-04-02 | Payer: BC Managed Care – PPO

## 2020-04-05 ENCOUNTER — Encounter: Admit: 2020-04-05 | Discharge: 2020-04-05 | Payer: BC Managed Care – PPO

## 2020-04-05 NOTE — Telephone Encounter
Unfortunately, MAB infusion therapy is unable to accommodate patient due to infusion scheduling and her window from symptoms onset. Pt notified.

## 2020-04-05 NOTE — Telephone Encounter
Incoming call from [place]. She reports she was positive for Covid on 12/31 and her symptoms began on 12/26.    Referral sent to MAB therapy at [facility] to evaluate for infusion.    Pt inquiring if she should keep her appt tomorrow. Per Dr. Noelle Penner, reschedule appointment.    Pt reports she is congested, has a headache, and a cough. Denies fevers, chest pain/shortness of breath, vaginal bleeding, loss of fluid, or abdominal/back pain. Reports +FM.    Notified patient we will reschedule her appt. She can keep her previously scheduled appt on 1/11 or we could see her on 1/10 at the main campus. Discussed s/s to seek medical care for including: persistent fever, chest pain, shortness of breath, inability to tolerate food/drink, or worsening symptoms to come in to the ED. Discussed OTC medications she can use to treat symptoms. Also reviewed OB concerns to seek care for including vaginal bleeding, loss of fluid, regular/painful cramping/contractions, or decreased fetal movement. Encouraged her to seek care at any point if she is concerned.    She agreed to plan discussed above and would like to keep her 1/11 appointment. She v/u of s/s that she should seek medical care for.

## 2020-04-13 ENCOUNTER — Encounter: Admit: 2020-04-13 | Discharge: 2020-04-14 | Payer: BC Managed Care – PPO

## 2020-04-13 NOTE — Assessment & Plan Note
BP 132/74 today, continue aspirin 162 mg daily.

## 2020-04-13 NOTE — Assessment & Plan Note
Valacyclovir 500 mg bid prescribed 04/13/20.

## 2020-04-13 NOTE — Assessment & Plan Note
Normal anatomy this pregnancy  NIPS wnl.

## 2020-04-13 NOTE — Progress Notes
Maternal fetal medicine follow up visit    Melissa Hatfield is a 30 y.o. G2P1001 at [redacted]w[redacted]d who presents for follow up.    S: feels very tired.  Mood overall ok, just fatigued from pregnancy.    With regards to her abdominal wall, she feels a lot of stretching and tenderness whenever touching the right paramedian area.  Lidocaine jelly helps with skin, she did not use voltaren since she was worried about the product labeling.      I explained that since the medication is topical, she can use it occasionally.      Melissa Hatfield specifically worried about emotional trauma if she ends up with an intrapartum CS.  She specficially requests an amnestic or general anesthesia in that situation.  She is fine with epidural for TOLAC stage, but would not be ok with just regional anesthesia during a surgery.       Reports good FM.  Neg ctx, neg rom, neg vb.      BP 132/74 (BP Source: Arm, Right Upper, Patient Position: Sitting)  - Ht 174 cm (68.5)  - Wt 95.3 kg (210 lb)  - LMP 08/10/2019 (Exact Date)  - BMI 31.47 kg/m?        General:   Abd/chest exam:  Remains tender over right inferior rib cage, just immediately lateral to xiphoid. In addition, now right midline of abdominal wall is tender, not guarding or rigid wall.   Abd: soft, nt, gravid (fundus at umbilicus)  Ext: no LE edema b/l      SONO  12/23/19 [redacted]w[redacted]d, EFW 37%, limited views of heart/diaphragm, Low-lying placenta  01/20/20 [redacted]w[redacted]d, EFW 46%, normal anatomy  02/17/20 [redacted]w[redacted]d, EFW 34%, AFI 15 cm.  Low lying placenta resolved  03/16/20 [redacted]w[redacted]d, EFW 76%, AC 81%, AFI 18 cm  04/13/20 [redacted]w[redacted]d, EFW 76%, AC 91%, AFI 18 cm    Labs:  02/21/20 RPR neg, Hgb 10.7, plt 239, TSH 0.9, ab screen neg,  03/04/20 2h GTT, 82/127/102     A/P    30 y.o. G2P1001 with IUP at [redacted]w[redacted]d    Mass vs. swelling mid-clavicular region in the intercostal space between ribs 9 and 10  ? 03/14/20 CXR neg for mass  ? If no mass observed then I will treat as a costochondritis.    ? Stretching exercise sheet given to patient  ? Instructed on heat and ice therapy  ? E-scribed Rx for Percocet 325/5 to her pharmacy (#12 prescribed).   ? 03/16/20 offered voltaren gel.  Melissa Hatfield did not use as she was worried about product labeling warning against use in last 3 months pregnancy.  ? 04/13/20 explained that gel is dermal/topical only, and has almost no systemic absorption. Can try occasional use PRN.  ? Next step may need to be gabapentin for neuropathic pain.      Supervision of high risk pregnancy, antepartum  Flu vaccine 02/17/20  Tdap 03/16/20.   S/p COVID19 vaccine 06/2019 2 courses  BCM - desires BTL, signed T19 form 03/16/20.   Weekly visits now.   IOL scheduled 05/09/20 20pm.     History of pre-eclampsia  BP 132/74 today, continue aspirin 162 mg daily.     History of C-section  Desires TOLAC.   Current right paramedian tenderness is likely prior scarring from CS, now undergoing stretch/revision.  Advised head/tylenol/voltaren gel PRN.  - anesthesia concerns, Melissa Hatfield specifically worried about emotional trauma if she ends up with an intrapartum CS.  She specficially requests an amnestic or general anesthesia in  that situation.  She is fine with epidural for TOLAC stage, but would not be ok with just regional anesthesia during a surgery.     History of fetal abnormality in previous pregnancy, currently pregnant  Normal anatomy this pregnancy  NIPS wnl.     HSV infection  Valacyclovir 500 mg bid prescribed 04/13/20.     Maternal rheumatoid arthritis complicating pregnancy (HCC)  No new arthritis symptoms.  Remains off biologics.     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  No homicidal/suicidal ideation, but she is definitely tired of pregnancy.    Dr. Bertram Millard co-following.       A total of 30 minutes was spent preparing to see the patient on this date, obtaining and/or reviewing separately obtained history, performing a medically appropriate examination and/or evaluation, counseling and educating the patient/family/caregiver, documenting clinical information in the electronic or other health record and/or interpreting fetal ultrasound findings that pertained to Melissa Hatfield's care.    Algernon Huxley, MD

## 2020-04-13 NOTE — Progress Notes
Melissa Hatfield presents for an ultrasound encounter. Past Medical, Surgical, Family & Social History; Medications & Allergies contained in the electronic record below were not reviewed today and may not be up-to-date. Please see [facility] report for all documentation related to this encounter.    04/13/2020  Stoney Bang

## 2020-04-13 NOTE — Assessment & Plan Note
No new arthritis symptoms.  Remains off biologics.

## 2020-04-14 DIAGNOSIS — O0993 Supervision of high risk pregnancy, unspecified, third trimester: Secondary | ICD-10-CM

## 2020-04-14 LAB — CULTURE-STREP SCREEN

## 2020-04-15 ENCOUNTER — Encounter: Admit: 2020-04-15 | Discharge: 2020-04-15 | Payer: BC Managed Care – PPO

## 2020-04-20 ENCOUNTER — Encounter: Admit: 2020-04-20 | Discharge: 2020-04-20 | Payer: BC Managed Care – PPO

## 2020-04-20 DIAGNOSIS — Z98891 History of uterine scar from previous surgery: Secondary | ICD-10-CM

## 2020-04-20 DIAGNOSIS — O99340 Other mental disorders complicating pregnancy, unspecified trimester: Secondary | ICD-10-CM

## 2020-04-20 DIAGNOSIS — O09299 Supervision of pregnancy with other poor reproductive or obstetric history, unspecified trimester: Secondary | ICD-10-CM

## 2020-04-20 DIAGNOSIS — O99891 Maternal rheumatoid arthritis complicating pregnancy (HCC): Secondary | ICD-10-CM

## 2020-04-20 DIAGNOSIS — O99343 Other mental disorders complicating pregnancy, third trimester: Secondary | ICD-10-CM

## 2020-04-26 NOTE — Progress Notes
Maternal fetal medicine follow up visit    Melissa Hatfield is a 30 y.o. 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 Denies any contractions. LOF, VB. She is feeling good fetal movement. Patient has been having persistent RUQ pain that wraps around (has had it for a while, worse last night). HA that are new and some changes to visit.     BP (!) 131/91  - Pulse 107  - Ht 174 cm (68.5)  - Wt 95.3 kg (210 lb)  - LMP 08/10/2019 (Exact Date)  - BMI 31.47 kg/m?    General: NAD  Abd: soft, nt, gravid  Ext: no LE edema b/l  FHR: obtained by doppler      30 y.o. G2P1001 at [redacted]w[redacted]d with the following issues that were discussed     Maternal rheumatoid arthritis complicating pregnancy (HCC)  No new arthritis symptoms.  Remains off biologics.     Supervision of high risk pregnancy, antepartum  Flu vaccine 02/17/20  Tdap 03/16/20.   S/p COVID19 vaccine 06/2019 2 courses  BCM - desires BTL, signed T19 form 03/16/20.   Weekly visits now.   IOL scheduled 05/09/20 20pm.   GBS redrawn 04/27/20 in triage since last GBS did not result    History of pre-eclampsia  BP mild range today, continue aspirin 162 mg daily.     History of C-section  Desires TOLAC.   Current right paramedian tenderness is likely prior scarring from CS, now undergoing stretch/revision.  Advised head/tylenol/voltaren gel PRN.  - anesthesia concerns, Snow specifically worried about emotional trauma if she ends up with an intrapartum CS.  She specficially requests an amnestic or general anesthesia in that situation.  She is fine with epidural for TOLAC stage, but would not be ok with just regional anesthesia during a surgery. History of fetal abnormality in previous pregnancy, currently pregnant  Normal anatomy this pregnancy  NIPS wnl.     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  No homicidal/suicidal ideation, but she is definitely tired of pregnancy.    Dr. Bertram Millard co-following.       Ophelia Shoulder, MD     Orders Placed This Encounter   ? POC URINE DIPSTICK MANUAL READ        Total Time Today was 25 minutes in the following activities: Preparing to see the patient, Obtaining and/or reviewing separately obtained history, Performing a medically appropriate examination and/or evaluation, Counseling and educating the patient/family/caregiver, Referring and communication with other health care professionals (when not separately reported) and Documenting clinical information in the electronic or other health record

## 2020-04-26 NOTE — Assessment & Plan Note
Desires TOLAC.   Current right paramedian tenderness is likely prior scarring from CS, now undergoing stretch/revision.  Advised head/tylenol/voltaren gel PRN.  - anesthesia concerns, Chea specifically worried about emotional trauma if she ends up with an intrapartum CS.  She specficially requests an amnestic or general anesthesia in that situation.  She is fine with epidural for TOLAC stage, but would not be ok with just regional anesthesia during a surgery.

## 2020-04-26 NOTE — Assessment & Plan Note
No new arthritis symptoms.  Remains off biologics.

## 2020-04-26 NOTE — Assessment & Plan Note
Increased to celexa 50 mg daily.  No homicidal/suicidal ideation, but she is definitely tired of pregnancy.    Dr. Bertram Millard co-following.

## 2020-04-26 NOTE — Assessment & Plan Note
Normal anatomy this pregnancy  NIPS wnl.

## 2020-04-27 ENCOUNTER — Encounter: Admit: 2020-04-27 | Discharge: 2020-04-27 | Payer: BC Managed Care – PPO

## 2020-04-27 DIAGNOSIS — Z9089 Acquired absence of other organs: Secondary | ICD-10-CM

## 2020-04-27 DIAGNOSIS — G43909 Migraine, unspecified, not intractable, without status migrainosus: Secondary | ICD-10-CM

## 2020-04-27 DIAGNOSIS — O99343 Other mental disorders complicating pregnancy, third trimester: Secondary | ICD-10-CM

## 2020-04-27 DIAGNOSIS — B009 Herpesviral infection, unspecified: Secondary | ICD-10-CM

## 2020-04-27 DIAGNOSIS — F419 Anxiety disorder, unspecified: Secondary | ICD-10-CM

## 2020-04-27 DIAGNOSIS — K319 Disease of stomach and duodenum, unspecified: Secondary | ICD-10-CM

## 2020-04-27 DIAGNOSIS — M0800 Unspecified juvenile rheumatoid arthritis of unspecified site: Secondary | ICD-10-CM

## 2020-04-27 DIAGNOSIS — F4310 Post-traumatic stress disorder, unspecified: Secondary | ICD-10-CM

## 2020-04-27 DIAGNOSIS — O09299 Supervision of pregnancy with other poor reproductive or obstetric history, unspecified trimester: Secondary | ICD-10-CM

## 2020-04-27 DIAGNOSIS — Z8759 Personal history of other complications of pregnancy, childbirth and the puerperium: Secondary | ICD-10-CM

## 2020-04-27 DIAGNOSIS — O99891 Maternal rheumatoid arthritis complicating pregnancy (HCC): Secondary | ICD-10-CM

## 2020-04-27 DIAGNOSIS — O0990 Supervision of high risk pregnancy, unspecified, unspecified trimester: Secondary | ICD-10-CM

## 2020-04-27 DIAGNOSIS — Z98891 History of uterine scar from previous surgery: Secondary | ICD-10-CM

## 2020-04-27 LAB — PROTEIN/CR RATIO,UR RAN: Lab: 8 mg/dL (ref 70–100)

## 2020-04-27 LAB — URINALYSIS, MICROSCOPIC

## 2020-04-27 LAB — COMPREHENSIVE METABOLIC PANEL: Lab: 139 MMOL/L — ABNORMAL LOW (ref 137–147)

## 2020-04-27 LAB — URINALYSIS DIPSTICK
Lab: NEGATIVE
Lab: NEGATIVE (ref 3–12)
Lab: NEGATIVE MMOL/L (ref 21–30)
Lab: NEGATIVE U/L (ref 7–40)
Lab: NEGATIVE U/L — ABNORMAL HIGH (ref 25–110)
Lab: NEGATIVE mL/min

## 2020-04-27 LAB — LDH-LACTATE DEHYDROGENASE: Lab: 175 U/L — ABNORMAL LOW (ref 100–210)

## 2020-04-27 LAB — CBC: Lab: 11 K/UL — ABNORMAL HIGH (ref 4.5–11.0)

## 2020-04-27 LAB — URIC ACID: Lab: 6.3 mg/dL — ABNORMAL LOW (ref 2.0–7.0)

## 2020-04-27 NOTE — Discharge Instructions - Appointments
Please check your blood pressure daily using a home blood pressure cuff. If at any time your blood pressure is greater than  140 systolic (top number) or greater than 90 diastolic (bottom number), please call Labor & Delivery at [phone] right away or present to [facility] triage.

## 2020-04-28 ENCOUNTER — Encounter: Admit: 2020-04-28 | Discharge: 2020-04-28 | Payer: BC Managed Care – PPO

## 2020-04-28 NOTE — Telephone Encounter
Pt called to report that she got food poisoning yesterday form chinese food. Pt reports that she is having diarrhea, nausea, and a headache. Pt reports that she went to her nearest hospital last night and received fluids and antiemetics. Pt states she has stopped vomiting. Pt reports she is recovered from COVID from a month ago. Denies cough or other COVID s/s. Pt reports having irregular cxt. Pt states she has about 5 an hour then may go away the next hour. Pt denies LOF, VB, +FM.   Per Dr. Bennetta Laos, pt recommended to continue to hydrate with fluids and Pedialyte. Pt recommended to come to hospital if cxts become regular and strong, any LOF, VB, any PreE s/s. Pt verbalizes understanding and denies any further questions.

## 2020-04-28 NOTE — Progress Notes
Haydee Monica, RN  Vickie Epley, MD; [facility]  Attempted to contact patient to set up BP check. Patient states she is on the other line speaking with her provider office about last night concerns. [facility] nurse will need to reach out again later in week.   Paula Compton            Previous Messages      ----- Message -----   From: Vickie Epley, MD   Sent: 04/27/2020  2:44 PM CST   To: [facility]     Hello!     Would you be able to help me coordinate a BP nursing visit for this patient at the [place] office? I'm not sure how to contact those nurses. This Friday or next Monday would be ideal.     Thanks!   Denny Peon

## 2020-04-29 ENCOUNTER — Encounter: Admit: 2020-04-29 | Discharge: 2020-04-29 | Payer: BC Managed Care – PPO

## 2020-04-29 NOTE — Telephone Encounter
this RN instructed pt that with her BP of 134/88 that she does not need to go to the hospital at this time. the pt reported that she felt better at the moment. this RN told the pt that if she feels like things are getting worse, elevated BP, visual disturbances, loss of fluids, vaginal bleeding, contractions, that she should go to [facility] to be evaluated.

## 2020-05-04 ENCOUNTER — Encounter: Admit: 2020-05-04 | Discharge: 2020-05-04 | Payer: BC Managed Care – PPO

## 2020-05-04 NOTE — Assessment & Plan Note
Flu vaccine 02/17/20  Tdap 03/16/20.   S/p COVID19 vaccine 06/2019 2 courses  BCM - desires BTL, signed T19 form 03/16/20.   Weekly visits now.   IOL scheduled 05/09/20 20pm.   GBS redrawn 04/27/20 in triage since last GBS did not result-never drawn, drawn 05/06/20

## 2020-05-04 NOTE — Assessment & Plan Note
Desires TOLAC.   Current right paramedian tenderness is likely prior scarring from CS, now undergoing stretch/revision.  Advised head/tylenol/voltaren gel PRN.  - anesthesia concerns, Melissa Hatfield specifically worried about emotional trauma if she ends up with an intrapartum CS.  She specficially requests an amnestic or general anesthesia in that situation.  She is fine with epidural for TOLAC stage, but would not be ok with just regional anesthesia during a surgery.

## 2020-05-04 NOTE — Assessment & Plan Note
Normal anatomy this pregnancy  NIPS wnl.

## 2020-05-04 NOTE — Telephone Encounter
Incoming call from Melissa Hatfield. She is experiencing heartburn that has worsened in the past one-two weeks. She is sure it is heartburn. She just finished 14 days of Prevacid and is wondering if she can restart this medication as it greatly helped her symptoms. She is also wondering about using Pepcid Complete to help with her symptoms.    Spoke with DR Noelle Penner- ok patient to start another course of prevacid and may also use pepcid complete for symptoms if needed. Pt updated and v/u.

## 2020-05-04 NOTE — Assessment & Plan Note
No new arthritis symptoms.  Remains off biologics.

## 2020-05-06 ENCOUNTER — Encounter: Admit: 2020-05-06 | Discharge: 2020-05-06 | Payer: BC Managed Care – PPO

## 2020-05-06 ENCOUNTER — Ambulatory Visit: Admit: 2020-05-06 | Discharge: 2020-05-06 | Payer: BC Managed Care – PPO

## 2020-05-06 DIAGNOSIS — Z9089 Acquired absence of other organs: Secondary | ICD-10-CM

## 2020-05-06 DIAGNOSIS — O99343 Other mental disorders complicating pregnancy, third trimester: Secondary | ICD-10-CM

## 2020-05-06 DIAGNOSIS — O368130 Decreased fetal movements, third trimester, not applicable or unspecified: Secondary | ICD-10-CM

## 2020-05-06 DIAGNOSIS — B009 Herpesviral infection, unspecified: Secondary | ICD-10-CM

## 2020-05-06 DIAGNOSIS — M0800 Unspecified juvenile rheumatoid arthritis of unspecified site: Secondary | ICD-10-CM

## 2020-05-06 DIAGNOSIS — F419 Anxiety disorder, unspecified: Secondary | ICD-10-CM

## 2020-05-06 DIAGNOSIS — O0990 Supervision of high risk pregnancy, unspecified, unspecified trimester: Secondary | ICD-10-CM

## 2020-05-06 DIAGNOSIS — K319 Disease of stomach and duodenum, unspecified: Secondary | ICD-10-CM

## 2020-05-06 DIAGNOSIS — G43909 Migraine, unspecified, not intractable, without status migrainosus: Secondary | ICD-10-CM

## 2020-05-06 DIAGNOSIS — O99891 Maternal rheumatoid arthritis complicating pregnancy (HCC): Secondary | ICD-10-CM

## 2020-05-06 DIAGNOSIS — F4310 Post-traumatic stress disorder, unspecified: Secondary | ICD-10-CM

## 2020-05-06 DIAGNOSIS — O09299 Supervision of pregnancy with other poor reproductive or obstetric history, unspecified trimester: Secondary | ICD-10-CM

## 2020-05-06 DIAGNOSIS — Z98891 History of uterine scar from previous surgery: Secondary | ICD-10-CM

## 2020-05-06 DIAGNOSIS — Z8759 Personal history of other complications of pregnancy, childbirth and the puerperium: Secondary | ICD-10-CM

## 2020-05-09 ENCOUNTER — Inpatient Hospital Stay: Admit: 2020-05-09 | Discharge: 2020-05-09 | Payer: BC Managed Care – PPO

## 2020-05-10 ENCOUNTER — Encounter: Admit: 2020-05-10 | Discharge: 2020-05-10 | Payer: BC Managed Care – PPO

## 2020-05-10 ENCOUNTER — Inpatient Hospital Stay: Admit: 2020-05-10 | Discharge: 2020-05-10 | Payer: BC Managed Care – PPO

## 2020-05-10 DIAGNOSIS — G43909 Migraine, unspecified, not intractable, without status migrainosus: Secondary | ICD-10-CM

## 2020-05-10 DIAGNOSIS — Z9089 Acquired absence of other organs: Secondary | ICD-10-CM

## 2020-05-10 DIAGNOSIS — M0800 Unspecified juvenile rheumatoid arthritis of unspecified site: Secondary | ICD-10-CM

## 2020-05-10 DIAGNOSIS — F4310 Post-traumatic stress disorder, unspecified: Secondary | ICD-10-CM

## 2020-05-10 DIAGNOSIS — F419 Anxiety disorder, unspecified: Secondary | ICD-10-CM

## 2020-05-10 DIAGNOSIS — K319 Disease of stomach and duodenum, unspecified: Secondary | ICD-10-CM

## 2020-05-10 DIAGNOSIS — B009 Herpesviral infection, unspecified: Secondary | ICD-10-CM

## 2020-05-10 MED ORDER — LIDOCAINE (PF) 10 MG/ML (1 %) IJ SOLN
INTRAMUSCULAR | 0 refills | Status: CP
Start: 2020-05-10 — End: ?
  Administered 2020-05-10: 17:00:00 5 mL via INTRAMUSCULAR

## 2020-05-10 MED ORDER — LIDOCAINE-EPINEPHRINE (PF) 1.5 %-1:200,000 IJ SOLN (OR)
0 refills | Status: CP
Start: 2020-05-10 — End: ?

## 2020-05-10 MED ADMIN — LACTATED RINGERS IV SOLP [4318]: 1000.000 mL | INTRAVENOUS | @ 08:00:00 | Stop: 2020-05-11 | NDC 00338011704

## 2020-05-10 MED ADMIN — FENTANYL/BUPIVACAINE 2 MCG/ML-0.1% PCA EPIDURAL SYRINGE [211823]: 50.000 mL | EPIDURAL | @ 20:00:00 | Stop: 2020-05-11 | NDC 54029021902

## 2020-05-10 MED ADMIN — VALACYCLOVIR 500 MG PO TAB [79112]: 500 mg | ORAL | @ 15:00:00 | NDC 00904656561

## 2020-05-10 MED ADMIN — FENTANYL/BUPIVACAINE 2 MCG/ML-0.1% PCA EPIDURAL SYRINGE [211823]: 50.000 mL | EPIDURAL | Stop: 2020-05-11 | NDC 54029021902

## 2020-05-10 MED ADMIN — LACTATED RINGERS IV SOLP [4318]: INTRAVENOUS | @ 04:00:00 | Stop: 2020-05-10 | NDC 00338011704

## 2020-05-10 MED ADMIN — LACTATED RINGERS IV SOLP [4318]: 1000.000 mL | INTRAVENOUS | @ 23:00:00 | Stop: 2020-05-11 | NDC 00338011704

## 2020-05-10 MED ADMIN — OXYTOCIN IN 0.9 % SOD CHLORIDE 30 UNIT/500 ML IV SOLN [94021]: 2 m[IU]/min | INTRAVENOUS | @ 04:00:00 | NDC 70092106807

## 2020-05-10 MED ADMIN — VALACYCLOVIR 500 MG PO TAB [79112]: 500 mg | ORAL | @ 03:00:00 | NDC 00904656561

## 2020-05-10 MED ADMIN — FENTANYL/BUPIVACAINE 2 MCG/ML-0.1% PCA EPIDURAL SYRINGE [211823]: 50.000 mL | EPIDURAL | @ 17:00:00 | Stop: 2020-05-11 | NDC 54029021902

## 2020-05-10 NOTE — Anesthesia Pre-Procedure Evaluation
Anesthesia Pre-Procedure Evaluation    Name: Melissa Hatfield      MRN: [id number]     DOB: 1990-06-25     Age: 30 y.o.     Sex: female   _________________________________________________________________________     Procedure Info:   Procedure Information    Date: 05/09/20  Procedure: ANESTHESIA PRE-EVAL         Physical Assessment  Vital Signs (last filed in past 24 hours):  BP: 118/67 (02/06 2134)  Temp: 36.8 ?C (98.2 ?F) (02/06 2020)  Pulse: 86 (02/06 2134)  Respirations: 18 PER MINUTE (02/06 2019)  SpO2: 98 % (02/06 2130)  Height: 174 cm (68.5) (02/06 2020)  Weight: 93.4 kg (206 lb) (02/06 2020)      Patient History   No Known Allergies     Current Medications    Medication Directions   acetaminophen (TYLENOL PO) Take  by mouth daily as needed.   aspirin EC 81 mg tablet Take 162 mg by mouth daily. Take with food.   bisacodyL (DULCOLAX) 5 mg tablet Take 5 mg by mouth every 24 hours as needed for Constipation.   citalopram (CELEXA) 10 mg tablet Take one tablet by mouth daily.   citalopram (CELEXA) 40 mg tablet Take one tablet by mouth daily.   diphenhydramine HCl (UNISOM (DIPHENHYDRAMINE) PO) Take 25 mg by mouth daily.   lansoprazole DR (PREVACID) 15 mg capsule Take 15 mg by mouth daily 30 minutes before breakfast.   valACYclovir (VALTREX) 500 mg tablet Take one tablet by mouth every 12 hours. Indications: infection prophylaxis, medical   vitamins, prenatal w/iron & folate 65/1 mg tab Take 1 tablet by mouth daily.         Review of Systems/Medical History        PONV Screening: Female gender and Non-smoker  No history of anesthetic complications  No family history of anesthetic complications        Pulmonary           No recent URI      Shortness of breath: COVID recovered.      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    Abdominal Findings:         Gravid      Abdominal exam deferred    Neurological Findings:       Alert and oriented x 3    Constitutional findings:       No acute distress      Well-developed      Well-nourished       Diagnostic Tests  Hematology:   Lab Results   Component Value Date    HGB 11.2 04/27/2020    HCT 33.2 04/27/2020    PLTCT 239 04/27/2020    WBC 11.5 04/27/2020    NEUT 52.5 07/31/2013    ANC 4253 07/31/2013    ALC 2965 07/31/2013    MONA 7.4 07/31/2013    AMC 599 07/31/2013    EOSA 3.1 07/31/2013    ABC 32 07/31/2013    MCV 84.5 04/27/2020    MCH 28.6 04/27/2020    MCHC 33.8 04/27/2020    MPV 7.8 04/27/2020    RDW 15.5 04/27/2020         General Chemistry:   Lab Results   Component Value Date    NA 139 04/27/2020    K 4.2 04/27/2020    CL 106 04/27/2020    CO2 21 04/27/2020    GAP 12 04/27/2020    BUN 7 04/27/2020    CR 0.73 04/27/2020    GLU 97 04/27/2020    GLU 85 07/31/2013    CA 9.8 04/27/2020    ALBUMIN 3.7 04/27/2020    TOTBILI 0.3 04/27/2020      Coagulation: No results found for: PT, PTT, INR      Anesthesia Plan    ASA score: 3   Plan: epidural  NPO status: acceptable and full stomach      Informed Consent  Anesthetic plan and risks discussed with patient.  Use of blood products discussed with patient  Blood Consent: consented      Plan discussed with: CRNA and anesthesiologist.

## 2020-05-10 NOTE — Anesthesia Procedure Notes
Anesthesia Procedure: Epidural Block    EPIDURAL BLOCK    Date/Time: 05/10/2020 10:30 AM    Patient location: OB  Reason for block: labor epidural      Preprocedure checklist performed: 2 patient identifiers, risks & benefits discussed, patient evaluated, timeout performed, consent obtained, patient being monitored, existing labs reviewed, no anticoagulant within risk period and sterile drape    Sterile technique:  - Proper hand washing  - Cap, mask  - Sterile gloves  - Skin prep for antisepsis      Epidural Procedure  Patient position: sitting  Prep: ChloraPrep    Monitoring: BP and continuous pulse ox  Approach: midline  Location: lumbar  Level/Interspace: L3-4  Injection technique: LOR saline  Procedures: landmark technique      Number of attempts: 2    Needle/epidural catheter:      Needle type: Tuohy     Needle gauge: 17 G     Needle length: 3.5 in     Needle insertion depth: 5.5 cm     Catheter type: wire reinforced and multi orifice     Catheter size: 19 G     Catheter at skin depth: 11 cm    Procedure Outcome  Events: negative test dose, no paresthesia and negative aspiration test    Patient tolerance of procedure: patient tolerated the procedure well with no immediate complications}    Procedure Medications    Local Anesthesia: lidocaine PF 1% (10 mg/mL) injection, 5 mL  Test Dose: lidocaine 1.5% /EPINEPHrine 1:200,000 epidural test dose (5 mL amp), 3 mL          Performed by: Leane Platt, MD  Authorized by: Leane Platt, MD

## 2020-05-10 NOTE — Anesthesia Pre-Procedure Evaluation
Anesthesia Pre-Procedure Evaluation    Name: Melissa Hatfield      MRN: [id number]     DOB: 05/09/1990     Age: 30 y.o.     Sex: female   _________________________________________________________________________     Procedure Info:   Procedure Information    Date: 05/10/20  Procedure: ANESTHESIA LABOR EPIDURAL         Physical Assessment  Vital Signs (last filed in past 24 hours):  BP: 122/71 (02/07 0931)  Temp: 36.7 ?C (98.1 ?F) (02/07 4540)  Pulse: 74 (02/07 0931)  Respirations: 18 PER MINUTE (02/06 2019)  SpO2: 99 % (02/07 0930)  Height: 174 cm (68.5) (02/06 2020)  Weight: 93.4 kg (206 lb) (02/06 2020)      Patient History   No Known Allergies     Current Medications    Medication Directions   acetaminophen (TYLENOL PO) Take  by mouth daily as needed.   aspirin EC 81 mg tablet Take 162 mg by mouth daily. Take with food.   bisacodyL (DULCOLAX) 5 mg tablet Take 5 mg by mouth every 24 hours as needed for Constipation.   citalopram (CELEXA) 10 mg tablet Take one tablet by mouth daily.   citalopram (CELEXA) 40 mg tablet Take one tablet by mouth daily.   diphenhydramine HCl (UNISOM (DIPHENHYDRAMINE) PO) Take 25 mg by mouth daily.   lansoprazole DR (PREVACID) 15 mg capsule Take 15 mg by mouth daily 30 minutes before breakfast.   valACYclovir (VALTREX) 500 mg tablet Take one tablet by mouth every 12 hours. Indications: infection prophylaxis, medical   vitamins, prenatal w/iron & folate 65/1 mg tab Take 1 tablet by mouth daily.         Review of Systems/Medical History        PONV Screening: Female gender and Non-smoker  No history of anesthetic complications  No family history of anesthetic complications        Pulmonary           No recent URI      Shortness of breath: COVID recovered.      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    Abdominal Findings:         Gravid      Abdominal exam deferred    Neurological Findings:       Alert and oriented x 3    Constitutional findings:       No acute distress      Well-developed      Well-nourished       Diagnostic Tests  Hematology:   Lab Results   Component Value Date    HGB 10.7 05/09/2020    HCT 31.3 05/09/2020    PLTCT 246 05/09/2020    WBC 12.0 05/09/2020    NEUT 52.5 07/31/2013    ANC 4253 07/31/2013    ALC 2965 07/31/2013    MONA 7.4 07/31/2013    AMC 599 07/31/2013    EOSA 3.1 07/31/2013    ABC 32 07/31/2013    MCV 83.5 05/09/2020    MCH 28.6 05/09/2020    MCHC 34.2 05/09/2020    MPV 8.2 05/09/2020    RDW 15.6 05/09/2020         General Chemistry:   Lab Results   Component Value Date    NA 139 04/27/2020    K 4.2 04/27/2020    CL 106 04/27/2020    CO2 21 04/27/2020    GAP 12 04/27/2020    BUN 7 04/27/2020    CR 0.73 04/27/2020    GLU 97 04/27/2020    GLU 85 07/31/2013    CA 9.8 04/27/2020    ALBUMIN 3.7 04/27/2020    TOTBILI 0.3 04/27/2020      Coagulation: No results found for: PT, PTT, INR      Anesthesia Plan    ASA score: 3   Plan: epidural  NPO status: full stomach      Informed Consent  Anesthetic plan and risks discussed with patient.  Use of blood products discussed with patient  Blood Consent: consented      Plan discussed with: CRNA and anesthesiologist.

## 2020-05-11 ENCOUNTER — Encounter: Admit: 2020-05-11 | Discharge: 2020-05-11 | Payer: BC Managed Care – PPO

## 2020-05-11 DIAGNOSIS — M0800 Unspecified juvenile rheumatoid arthritis of unspecified site: Secondary | ICD-10-CM

## 2020-05-11 DIAGNOSIS — G43909 Migraine, unspecified, not intractable, without status migrainosus: Secondary | ICD-10-CM

## 2020-05-11 DIAGNOSIS — F4310 Post-traumatic stress disorder, unspecified: Secondary | ICD-10-CM

## 2020-05-11 DIAGNOSIS — F419 Anxiety disorder, unspecified: Secondary | ICD-10-CM

## 2020-05-11 DIAGNOSIS — K319 Disease of stomach and duodenum, unspecified: Secondary | ICD-10-CM

## 2020-05-11 DIAGNOSIS — B009 Herpesviral infection, unspecified: Secondary | ICD-10-CM

## 2020-05-11 DIAGNOSIS — Z9089 Acquired absence of other organs: Secondary | ICD-10-CM

## 2020-05-11 MED ADMIN — VALACYCLOVIR 500 MG PO TAB [79112]: 500 mg | ORAL | @ 15:00:00 | NDC 00904656561

## 2020-05-11 MED ADMIN — DOCUSATE SODIUM 100 MG PO CAP [2566]: 100 mg | ORAL | @ 15:00:00 | NDC 00904699880

## 2020-05-11 MED ADMIN — IBUPROFEN 600 MG PO TAB [3844]: 600 mg | ORAL | @ 13:00:00 | NDC 67877032001

## 2020-05-11 MED ADMIN — IBUPROFEN 600 MG PO TAB [3844]: 600 mg | ORAL | @ 07:00:00 | NDC 00904585461

## 2020-05-11 MED ADMIN — ACETAMINOPHEN 325 MG PO TAB [101]: 650 mg | ORAL | @ 23:00:00 | NDC 00904677361

## 2020-05-11 MED ADMIN — CALCIUM CARBONATE 200 MG CALCIUM (500 MG) PO CHEW [9385]: 500 mg | ORAL | Stop: 2020-05-11 | NDC 66553000401

## 2020-05-11 MED ADMIN — VALACYCLOVIR 500 MG PO TAB [79112]: 500 mg | ORAL | @ 02:00:00 | NDC 00904656561

## 2020-05-11 MED ADMIN — FENTANYL/BUPIVACAINE 2 MCG/ML-0.1% PCA EPIDURAL SYRINGE [211823]: 50.000 mL | EPIDURAL | @ 03:00:00 | Stop: 2020-05-11 | NDC 54029021902

## 2020-05-11 MED ADMIN — FAMOTIDINE 20 MG PO TAB [10011]: 20 mg | ORAL | @ 15:00:00 | NDC 60687059511

## 2020-05-11 MED ADMIN — LACTATED RINGERS IV SOLP [4318]: 1000.000 mL | INTRAVENOUS | @ 02:00:00 | Stop: 2020-05-11 | NDC 00338011704

## 2020-05-11 MED ADMIN — ACETAMINOPHEN 325 MG PO TAB [101]: 650 mg | ORAL | @ 19:00:00 | NDC 00904677361

## 2020-05-11 MED ADMIN — ACETAMINOPHEN 325 MG PO TAB [101]: 650 mg | ORAL | @ 13:00:00 | NDC 00904677361

## 2020-05-11 MED ADMIN — CITALOPRAM 10 MG PO TAB [30264]: 50 mg | ORAL | @ 02:00:00 | NDC 00904608461

## 2020-05-11 MED ADMIN — ACETAMINOPHEN 325 MG PO TAB [101]: 650 mg | ORAL | @ 07:00:00 | Stop: 2020-05-11 | NDC 00904677361

## 2020-05-11 MED ADMIN — IBUPROFEN 600 MG PO TAB [3844]: 600 mg | ORAL | @ 19:00:00 | NDC 67877032001

## 2020-05-11 MED ADMIN — CITALOPRAM 40 MG PO TAB [23490]: 50 mg | ORAL | @ 02:00:00 | NDC 69097082412

## 2020-05-11 MED ADMIN — MISOPROSTOL 200 MCG PO TAB [10629]: 800 ug | RECTAL | @ 05:00:00 | Stop: 2020-05-11 | NDC 68084004111

## 2020-05-12 MED ADMIN — DOCUSATE SODIUM 100 MG PO CAP [2566]: 100 mg | ORAL | @ 03:00:00 | NDC 00904699880

## 2020-05-12 MED ADMIN — CITALOPRAM 10 MG PO TAB [30264]: 50 mg | ORAL | @ 04:00:00 | NDC 00904608461

## 2020-05-12 MED ADMIN — IBUPROFEN 600 MG PO TAB [3844]: 600 mg | ORAL | @ 03:00:00 | NDC 67877032001

## 2020-05-12 MED ADMIN — IBUPROFEN 600 MG PO TAB [3844]: 600 mg | ORAL | @ 12:00:00 | Stop: 2020-05-12 | NDC 67877032001

## 2020-05-12 MED ADMIN — VALACYCLOVIR 500 MG PO TAB [79112]: 500 mg | ORAL | @ 16:00:00 | Stop: 2020-05-12 | NDC 00904656561

## 2020-05-12 MED ADMIN — IBUPROFEN 600 MG PO TAB [3844]: 600 mg | ORAL | @ 19:00:00 | Stop: 2020-05-12 | NDC 67877032001

## 2020-05-12 MED ADMIN — FAMOTIDINE 20 MG PO TAB [10011]: 20 mg | ORAL | @ 16:00:00 | Stop: 2020-05-12 | NDC 60687059511

## 2020-05-12 MED ADMIN — ACETAMINOPHEN 325 MG PO TAB [101]: 650 mg | ORAL | @ 19:00:00 | Stop: 2020-05-12 | NDC 00904677361

## 2020-05-12 MED ADMIN — VALACYCLOVIR 500 MG PO TAB [79112]: 500 mg | ORAL | @ 03:00:00 | NDC 00904656561

## 2020-05-12 MED ADMIN — ACETAMINOPHEN 325 MG PO TAB [101]: 650 mg | ORAL | @ 12:00:00 | Stop: 2020-05-12 | NDC 00904677361

## 2020-05-12 MED ADMIN — DOCUSATE SODIUM 100 MG PO CAP [2566]: 100 mg | ORAL | @ 16:00:00 | Stop: 2020-05-12 | NDC 00904699880

## 2020-05-25 ENCOUNTER — Ambulatory Visit: Admit: 2020-05-25 | Discharge: 2020-05-26 | Payer: BC Managed Care – PPO

## 2020-05-25 ENCOUNTER — Encounter: Admit: 2020-05-25 | Discharge: 2020-05-25 | Payer: BC Managed Care – PPO

## 2020-05-25 DIAGNOSIS — F439 Reaction to severe stress, unspecified: Secondary | ICD-10-CM

## 2020-05-25 DIAGNOSIS — O99345 Other mental disorders complicating the puerperium: Secondary | ICD-10-CM

## 2020-05-25 DIAGNOSIS — F339 Major depressive disorder, recurrent, unspecified: Secondary | ICD-10-CM

## 2020-05-25 MED ORDER — CITALOPRAM 10 MG PO TAB
10 mg | ORAL_TABLET | Freq: Every day | ORAL | 1 refills | Status: AC
Start: 2020-05-25 — End: ?

## 2020-05-25 MED ORDER — CITALOPRAM 40 MG PO TAB
40 mg | ORAL_TABLET | Freq: Every day | ORAL | 0 refills | Status: AC
Start: 2020-05-25 — End: ?

## 2020-05-25 MED ORDER — BUSPIRONE 15 MG PO TAB
15 mg | ORAL_TABLET | Freq: Two times a day (BID) | ORAL | 0 refills | Status: AC
Start: 2020-05-25 — End: ?

## 2020-05-25 NOTE — Telephone Encounter
Attempted to contact patient to f/u during the postpartum period, however she is unavailable at this time. LMTRC, awaiting call back.

## 2020-05-25 NOTE — Progress Notes
Date of Service: 05/25/2020    Subjective:      Melissa Hatfield is a 30 y.o. female  [id number]    Due to COVID-19 pandemic temporary changes to care delivery, this encounter was completed in person.   Consent to treat by this means was agreed to by Melissa Hatfield.     History of Present Illness   Melissa Hatfield is a 30 y.o. 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 I last saw her a month ago and continued citalopram.    Since her last visit, she delivered baby a few weeks ago. She reports that it was much, much easier this time. Labor was pretty painless and pushing didn't last too long. Her mood has been okay since our last visit. She is experiencing mood fluctuations over the last few weeks in addition to her normal anxiety. She reports that this has been precipitated by sleep deprivation. She is mostly able to sleep when baby is sleeping overnight, but she doesn't nap very well. She is having more trouble falling asleep now than she was prior to or during pregnancy. She is still having weird dreams that do wake her up overnight. Melissa Hatfield has been helping overnight so that Melissa Hatfield can get more sleep herself. She is eating okay since our last visit and her fiancee has been making sure she eats. She had one panicky night during the first night, but it hasn't been as bad since then. No suicidal ideation or plans to harm herself. No intrusive thoughts of harm to Melissa Hatfield, but she has been very anxious about her vomiting in the middle of the night (this was a problem with her first baby).       OB: G2P2 now 2 weeks postpartum. [name]. Pregnancy planned. [name]. Pregnancy was been complicated by RA and preeclampsia dx at delivery. She is bottle feeding baby. Had a previous bad experience with breastfeeding.     OB care:  [facility]    PMFSH Update:  Denies tobacco, EtOH, or Illicit drug use.     Lives in [place], [place] with fiancee Melissa Hatfield (42 years old), and [name]. 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 Will probably go back to work in early April     PMH significant for Juvenile RA and HSV     Contraception: Micronor, Melissa Hatfield will get a vasectomy at some point in the future     Review of Systems   Constitutional: Positive for appetite change and fatigue. Negative for chills, fever and unexpected weight change.   Respiratory: Positive for shortness of breath.    Cardiovascular: Positive for chest pain.   Gastrointestinal: Positive for constipation and nausea. Negative for diarrhea and vomiting.   Musculoskeletal: Positive for arthralgias.   Psychiatric/Behavioral: Positive for dysphoric mood and sleep disturbance. Negative for decreased concentration, hallucinations, self-injury and suicidal ideas. The patient is nervous/anxious. The patient is not hyperactive.      Objective:         ? acetaminophen (TYLENOL) 325 mg tablet Take two tablets by mouth every 4 hours as needed.   ? bisacodyL (DULCOLAX) 5 mg tablet Take 5 mg by mouth every 24 hours as needed for Constipation.   ? busPIRone (BUSPAR) 15 mg tablet Take one tablet by  mouth twice daily.   ? citalopram (CELEXA) 10 mg tablet Take one tablet by mouth daily.   ? citalopram (CELEXA) 40 mg tablet Take one tablet by mouth daily.   ? ibuprofen (MOTRIN) 600 mg tablet Take one tablet by mouth every 6 hours. Take with food.   ? lanolin (LAN-O-SOOTHE) topical cream Apply  topically to affected area as Needed.   ? lansoprazole DR (PREVACID) 15 mg capsule Take 15 mg by mouth daily 30 minutes before breakfast.   ? norethindrone (contraceptive) (ORTHO MICRONOR) 0.35 mg tablet Take one tablet by mouth daily.   ? simethicone (MYLICON) 80 mg chew tablet Chew one tablet by mouth every 6 hours as needed for Flatulence.   ? valACYclovir (VALTREX) 500 mg tablet Take one tablet by mouth every 12 hours. Indications: infection prophylaxis, medical   ? vitamins, prenatal w/iron & folate 65/1 mg tab Take 1 tablet by mouth daily.       There were no vitals filed for this visit. telehealth  There is no height or weight on file to calculate BMI. telehealth     Physical Exam  Vitals reviewed.   Constitutional:       General: She is not in acute distress.     Appearance: She is not ill-appearing.   HENT:      Head: Normocephalic and atraumatic.   Eyes:      Conjunctiva/sclera: Conjunctivae normal.   Pulmonary:      Effort: Pulmonary effort is normal. No respiratory distress.   Skin:     Findings: No erythema or rash.   Neurological:      Mental Status: She is alert and oriented to person, place, and time.         Mental Status Evaluation:    General/Constitutional: Appears stated age, in personal attire, good hygiene and grooming.   Eye Contact: Fair  Behavior: Appropriate and cooperative  Speech: regular rate and rhythm, normal tone and volume.   Mood: fine, anxious  Affect: dysthymic, restricted range, mood congruent  Thought Process: Linear, organized, easy to follow and understand.  Thought Content: Denies SI/HI.   Perception: Denies AVH. No evidence of paranoia, delusions, or illusions.   Associations: Intact  Insight/Judgment: good/good    Orientation: AOx3  Recent and remote memory: Intact  Attention span and concentration: Good  Cognition: Good  Language: Fluent in [employer] of knowledge and vocabulary: Good         Assessment and Plan:     Summary/Formulation:   Melissa Hatfield is a 30 y.o. 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 She presented around [redacted] weeks pregnant and has been stable on citalopram through most of the pregnancy, but trauma-related symptoms from her first pregnancy are beginning to surface as she gets closer to delivery this time around. We discussed that there is often a need to increase SSRIs in the third trimester, especially with prominent anxiety. Increased citalopram to 50mg  from mid-December. Discussed that this will be an increase above the normal/approved maximum dose. Some improvement in symptoms but picture complicated by recent COVID infection and associated physical discomfort. R/b/se were reviewed and pt voiced understanding. No acute safety concerns.       Problem 1:  Trauma- and stressor-related disorder; Improving  Problem 2:  Depression affecting pregnancy, postpartum; Improving  Problem 3:  Anxiety affecting pregnancy, postpartum; Worse    Plan:  ? Continue citalopram 50mg   po daily   ? Start buspirone 7.5mg  po bid x1 week then increase to 15mg  po bid  ? Continue searching for a therapist       Follow up:  Return to clinic in 4 weeks or sooner PRN    The proposed treatment plan was discussed with the patient/guardian who was provided the opportunity to ask questions and make suggestions regarding alternative treatment.     Safety plan discussed; no AVS via telehealth     Melissa Casey, MD

## 2020-06-30 ENCOUNTER — Encounter: Admit: 2020-06-30 | Discharge: 2020-06-30 | Payer: BC Managed Care – PPO

## 2020-06-30 ENCOUNTER — Ambulatory Visit: Admit: 2020-06-30 | Discharge: 2020-07-01 | Payer: BC Managed Care – PPO

## 2020-06-30 DIAGNOSIS — F4310 Post-traumatic stress disorder, unspecified: Secondary | ICD-10-CM

## 2020-06-30 DIAGNOSIS — M0800 Unspecified juvenile rheumatoid arthritis of unspecified site: Secondary | ICD-10-CM

## 2020-06-30 DIAGNOSIS — F419 Anxiety disorder, unspecified: Secondary | ICD-10-CM

## 2020-06-30 DIAGNOSIS — Z124 Encounter for screening for malignant neoplasm of cervix: Principal | ICD-10-CM

## 2020-06-30 DIAGNOSIS — Z9089 Acquired absence of other organs: Secondary | ICD-10-CM

## 2020-06-30 DIAGNOSIS — B009 Herpesviral infection, unspecified: Secondary | ICD-10-CM

## 2020-06-30 DIAGNOSIS — K319 Disease of stomach and duodenum, unspecified: Secondary | ICD-10-CM

## 2020-06-30 DIAGNOSIS — G43909 Migraine, unspecified, not intractable, without status migrainosus: Secondary | ICD-10-CM

## 2020-06-30 LAB — THIN PREP HOLD LABEL

## 2020-06-30 MED ORDER — COVID-19 VACC,MRNA(MODERNA)-PF 50 MCG/0.25 ML IM SUSP
50 ug | Freq: Once | INTRAMUSCULAR | 0 refills | Status: CP
Start: 2020-06-30 — End: ?

## 2020-06-30 NOTE — Patient Instructions
General Instructions  For prompt and efficient communication, MyChart is preferred. Please sign up.  [email or web address]      To Contact Me:   Please send a MyChart message to your doctor or call [phone] to reach your doctor's nurse team. Please only call once in a 24-hour period. Leave a voicemail for the nurse to respond.  Calls or messages received after 4pm will be returned the next business day.  To Have Medication Refilled:   Please use the MyChart Refill request or contact your pharmacy directly to request medication refills. Please allow 72 hours.  To Receive Your Test Results:  ? Please allow 2 business day for labs to result in MyChart.  ? Please allow 4 business days for other tests, including cardiac studies, blood bank, and radiology results.  ? It can take up to 10 days for pathology  Our Lab (Location, Hours, and Fasting Information)  Lab Location: the 1st floor of the Medical Office Building, directly to the left of the Information Desk.  Lab Hours: Monday 6:30 am-6 pm, Tuesday-Friday 7 am-6 pm, and Saturday 7 am-noon.   Fasting for Lab: For fasting labs, please:   Do not eat for at least 8-10 hours before having your labs drawn, drink plenty of water, and make sure to continue your medications as prescribed (unless otherwise specified).  Radiology is on the 2nd floor of the Medical Office Building. Please call [phone], option #1; Mammogram at [place] location, please call [phone], option #1.  To Schedule an Appointment:   Contact our schedulers at [phone] and select #1 to make an appointment. At this time, you will be encouraged to signed up to MyChart if you not active.     To Receive Appointment Reminders on Your Cell Phone:   Make sure we have your cell phone number, and text [facility] to [phone].  For Urgent Questions:   For medical emergencies, call 911.  On nights, weekends or holidays, call the Hospital operator at [phone], and ask for the ?Ob/Gyn Physician on call or Certified Nurse Midwife on call.?        We value your feedback and you may receive a survey about your experience with our office. If you had a favorable experience, the only positive survey results that we will receive are if we are rated in the 9 or 10 range out of 10 points. Please let us know why we did not earn 9-10 rating.  Thank you.

## 2020-07-07 ENCOUNTER — Encounter: Admit: 2020-07-07 | Discharge: 2020-07-07 | Payer: BC Managed Care – PPO

## 2020-07-07 MED ORDER — BUSPIRONE 15 MG PO TAB
ORAL_TABLET | Freq: Two times a day (BID) | 1 refills | Status: AC
Start: 2020-07-07 — End: ?

## 2020-07-19 ENCOUNTER — Ambulatory Visit: Admit: 2020-07-19 | Discharge: 2020-07-20 | Payer: BC Managed Care – PPO

## 2020-07-19 ENCOUNTER — Encounter: Admit: 2020-07-19 | Discharge: 2020-07-19 | Payer: BC Managed Care – PPO

## 2020-07-19 DIAGNOSIS — O99345 Other mental disorders complicating the puerperium: Secondary | ICD-10-CM

## 2020-07-19 DIAGNOSIS — F339 Major depressive disorder, recurrent, unspecified: Secondary | ICD-10-CM

## 2020-07-19 DIAGNOSIS — F439 Reaction to severe stress, unspecified: Secondary | ICD-10-CM

## 2020-07-19 NOTE — Progress Notes
Two patient-identifiers verified for HIPAA compliance: patient verbally consented to telehealth visit. Vitals provided by patient .

## 2020-07-19 NOTE — Progress Notes
Date of Service: 07/19/2020    Subjective:      Melissa Hatfield is a 30 y.o. female  [id number]    Due to COVID-19 pandemic temporary changes to care delivery, this encounter was completed in person.   Consent to treat by this means was agreed to by Melissa Hatfield.     History of Present Illness   Melissa Hatfield is a 30 y.o. 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 I last saw her a month ago and continued citalopram and added buspirone to target anxiety.    Since her last visit, pt has returned to work. She is happy to be back at work and out of the house. Reports that mood has been, fine, better. Just even. She is happy to not be pregnant anymore. Sleep has been fine, barely enough. Baby is waking up 1-2x/night between 11-6. Pt is mostly able to sleep when baby is sleeping. Weird dreams and physical symptoms of anxiety have improved since adding buspirone. Appetite is very okay, pt notes that she is a stress-junk food-eater. Anxious thoughts about Melissa Hatfield vomiting in the middle of the night have improved.     OB: G2P2 now 10 weeks postpartum. [name]. Pregnancy planned. [name]. Pregnancy was been complicated by RA and preeclampsia dx at delivery. She is bottle feeding baby. Had a previous bad experience with breastfeeding.      OB care: [facility] [facility]    PMFSH Update:  Denies tobacco, EtOH, or Illicit drug use.     Lives in [place], [place] with fiancee Melissa Hatfield (19 years old), and [name]. 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 Negative for chills, fever and unexpected weight change.   Respiratory: Positive for shortness of breath.    Cardiovascular: Positive for chest pain.   Gastrointestinal: Positive for constipation and nausea. Negative for diarrhea and vomiting.   Musculoskeletal: Positive for arthralgias.   Psychiatric/Behavioral: Positive for dysphoric mood and sleep disturbance. Negative for decreased concentration, hallucinations, self-injury and suicidal ideas. The patient is nervous/anxious. The patient is not hyperactive.      Objective:         ? acetaminophen (TYLENOL) 325 mg tablet Take two tablets by mouth every 4 hours as needed.   ? busPIRone (BUSPAR) 15 mg tablet Take 1 tablet by mouth twice daily   ? citalopram (CELEXA) 40 mg tablet Take one tablet by mouth daily.   ? ibuprofen (MOTRIN) 600 mg tablet Take one tablet by mouth every 6 hours. Take with food.   ? norethindrone (contraceptive) (ORTHO MICRONOR) 0.35 mg tablet Take one tablet by mouth daily.   ? valACYclovir (VALTREX) 500 mg tablet Take one tablet by mouth every 12 hours. Indications: infection prophylaxis, medical   ? vitamins, prenatal w/iron & folate 65/1 mg tab Take 1 tablet by mouth daily.       There were no vitals filed for this visit. telehealth  There is no height or weight on file to calculate BMI. telehealth  Physical Exam  Vitals reviewed.   Constitutional:       General: She is not in acute distress.     Appearance: She is not ill-appearing.   HENT:      Head: Normocephalic and atraumatic.   Eyes:      Conjunctiva/sclera: Conjunctivae normal.   Pulmonary:      Effort: Pulmonary effort is normal. No respiratory distress.   Skin:     Findings: No erythema or rash.   Neurological:      Mental Status: She is alert and oriented to person, place, and time.       Mental Status Evaluation:    General/Constitutional: Appears stated age, in personal attire, good hygiene and grooming.   Eye Contact: Fair  Behavior: Appropriate and cooperative  Speech: regular rate and rhythm, normal tone and volume.   Mood: good  Affect: dysthymic, restricted range, mood congruent  Thought Process: Linear, organized, easy to follow and understand.  Thought Content: Denies SI/HI.   Perception: Denies AVH. No evidence of paranoia, delusions, or illusions.   Associations: Intact  Insight/Judgment: good/good    Orientation: AOx3  Recent and remote memory: Intact  Attention span and concentration: Good  Cognition: Good  Language: Fluent in [employer] of knowledge and vocabulary: Good         Assessment and Plan:     Summary/Formulation:   Melissa Hatfield is a 30 y.o. 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 She presented around [redacted] weeks pregnant and has been stable on citalopram through most of the pregnancy; increased to 50mg  in mid-December and added buspirone after delivery for worsening anxiety. She is now back down to her pre-pregnancy citalopram dose (40mg ) and doing well. No acute safety concerns.     Problem 1:  Trauma- and stressor-related disorder; Improving  Problem 2:  Depression affecting pregnancy, postpartum; Improving  Problem 3:  Anxiety affecting pregnancy, postpartum; Improving    Plan:  ? Continue citalopram 40mg  po daily   ? Continue buspirone 15mg  po bid   ? Continue searching for a therapist     Follow up:  Return to clinic in 4 weeks or sooner PRN    The proposed treatment plan was discussed with the patient/guardian who was provided the opportunity to ask questions and make suggestions regarding alternative treatment.     Safety plan discussed; no AVS via telehealth     Melissa Casey, MD

## 2020-09-17 ENCOUNTER — Encounter: Admit: 2020-09-17 | Discharge: 2020-09-17 | Payer: BC Managed Care – PPO

## 2020-09-17 MED ORDER — CITALOPRAM 40 MG PO TAB
40 mg | ORAL_TABLET | Freq: Every day | ORAL | 0 refills | Status: AC
Start: 2020-09-17 — End: ?

## 2020-09-17 NOTE — Telephone Encounter
Last office visit 07/19/2020   Continue citalopram 40mg  po daily   Medication refilled per protocol

## 2020-09-20 ENCOUNTER — Encounter: Admit: 2020-09-20 | Discharge: 2020-09-20 | Payer: BC Managed Care – PPO

## 2020-09-20 MED ORDER — BUSPIRONE 15 MG PO TAB
15 mg | ORAL_TABLET | Freq: Two times a day (BID) | ORAL | 2 refills | Status: AC
Start: 2020-09-20 — End: ?

## 2021-06-13 ENCOUNTER — Encounter: Admit: 2021-06-13 | Discharge: 2021-06-13 | Payer: BC Managed Care – PPO

## 2021-06-23 ENCOUNTER — Encounter: Admit: 2021-06-23 | Discharge: 2021-06-23 | Payer: BC Managed Care – PPO

## 2021-06-23 DIAGNOSIS — M25571 Pain in right ankle and joints of right foot: Secondary | ICD-10-CM

## 2021-06-23 DIAGNOSIS — M79671 Pain in right foot: Secondary | ICD-10-CM

## 2021-06-23 NOTE — Telephone Encounter
Contacted pt about upcoming appt on 06/24/21 at 1530. This RN went over the clinic location and parking information. Pt verbalized understanding and confirmed appt. Pt denies any other questions at this time.

## 2021-06-24 ENCOUNTER — Ambulatory Visit: Admit: 2021-06-24 | Discharge: 2021-06-24 | Payer: BC Managed Care – PPO

## 2021-06-24 ENCOUNTER — Encounter: Admit: 2021-06-24 | Discharge: 2021-06-24 | Payer: BC Managed Care – PPO

## 2021-06-24 DIAGNOSIS — G43909 Migraine, unspecified, not intractable, without status migrainosus: Secondary | ICD-10-CM

## 2021-06-24 DIAGNOSIS — B009 Herpesviral infection, unspecified: Secondary | ICD-10-CM

## 2021-06-24 DIAGNOSIS — Z9089 Acquired absence of other organs: Secondary | ICD-10-CM

## 2021-06-24 DIAGNOSIS — F4310 Post-traumatic stress disorder, unspecified: Secondary | ICD-10-CM

## 2021-06-24 DIAGNOSIS — M25571 Pain in right ankle and joints of right foot: Secondary | ICD-10-CM

## 2021-06-24 DIAGNOSIS — K319 Disease of stomach and duodenum, unspecified: Secondary | ICD-10-CM

## 2021-06-24 DIAGNOSIS — M0800 Unspecified juvenile rheumatoid arthritis of unspecified site: Secondary | ICD-10-CM

## 2021-06-24 DIAGNOSIS — F419 Anxiety disorder, unspecified: Secondary | ICD-10-CM

## 2021-06-24 MED ORDER — MELOXICAM 15 MG PO TAB
15 mg | ORAL_TABLET | Freq: Every day | ORAL | 1 refills | 30.00000 days | Status: AC
Start: 2021-06-24 — End: ?

## 2021-06-24 MED ORDER — GABAPENTIN 100 MG PO CAP
200 mg | ORAL_CAPSULE | ORAL | 3 refills | Status: AC
Start: 2021-06-24 — End: ?

## 2021-06-24 NOTE — Telephone Encounter
Contacted pt about coming in earlier for appt. Pt stated that she could not come in early but she will be here at her scheduled appt time. This RN explained that would be fine. Pt verbalized understanding and confirmed that was okay. Pt denies any other questions at this time.

## 2021-07-20 ENCOUNTER — Encounter: Admit: 2021-07-20 | Discharge: 2021-07-20 | Payer: BC Managed Care – PPO

## 2021-07-20 DIAGNOSIS — K319 Disease of stomach and duodenum, unspecified: Secondary | ICD-10-CM

## 2021-07-20 DIAGNOSIS — Z9089 Acquired absence of other organs: Secondary | ICD-10-CM

## 2021-07-20 DIAGNOSIS — M0800 Unspecified juvenile rheumatoid arthritis of unspecified site: Secondary | ICD-10-CM

## 2021-07-20 DIAGNOSIS — B009 Herpesviral infection, unspecified: Secondary | ICD-10-CM

## 2021-07-20 DIAGNOSIS — F419 Anxiety disorder, unspecified: Secondary | ICD-10-CM

## 2021-07-20 DIAGNOSIS — G43909 Migraine, unspecified, not intractable, without status migrainosus: Secondary | ICD-10-CM

## 2021-07-20 DIAGNOSIS — F4310 Post-traumatic stress disorder, unspecified: Secondary | ICD-10-CM

## 2021-08-02 ENCOUNTER — Encounter: Admit: 2021-08-02 | Discharge: 2021-08-02 | Payer: BC Managed Care – PPO

## 2022-02-04 ENCOUNTER — Encounter: Admit: 2022-02-04 | Discharge: 2022-02-04 | Payer: BC Managed Care – PPO

## 2023-07-30 ENCOUNTER — Encounter: Admit: 2023-07-30 | Discharge: 2023-07-30 | Payer: BLUE CROSS/BLUE SHIELD

## 2023-07-30 NOTE — Telephone Encounter
 07-30-2023 Per Task Message, request faxed to Dr Anner Bars, (F) [phone], clp    Please request additional records from PCP, Dr. Anner Bars - Ph: [phone]; Fax: [phone]. Does not recall previous cardiac care.

## 2023-08-02 ENCOUNTER — Encounter: Admit: 2023-08-02 | Discharge: 2023-08-02 | Payer: BLUE CROSS/BLUE SHIELD

## 2023-08-02 NOTE — Telephone Encounter
 08/02/23- Referral received from [facility] have been scanned to chart and available in the Media tab.    Thank you,  Lamon Rotundo W  HIM Specialist - Cardiovascular Medicine  [facility]  [address]  [place], [place]  [address]  [phone]

## 2023-08-10 ENCOUNTER — Encounter: Admit: 2023-08-10 | Discharge: 2023-08-10 | Payer: BLUE CROSS/BLUE SHIELD

## 2023-08-17 ENCOUNTER — Encounter: Admit: 2023-08-17 | Discharge: 2023-08-17 | Payer: BLUE CROSS/BLUE SHIELD

## 2023-08-17 NOTE — Progress Notes
 Records Request    Medical records request for continuation of care:      Please fax records to Cardiovascular Medicine [facility] [phone]    Request records:    Office Notes    Recent Labs      Thank you,      Cardiovascular Medicine  [facility]  [address]  [place], [place] [address]  Phone:  [phone]  Fax:  [phone]

## 2023-08-30 ENCOUNTER — Encounter: Admit: 2023-08-30 | Discharge: 2023-08-30 | Payer: BLUE CROSS/BLUE SHIELD

## 2023-10-01 ENCOUNTER — Encounter: Admit: 2023-10-01 | Discharge: 2023-10-01 | Payer: BLUE CROSS/BLUE SHIELD

## 2023-10-04 ENCOUNTER — Encounter: Admit: 2023-10-04 | Discharge: 2023-10-04 | Payer: BLUE CROSS/BLUE SHIELD

## 2023-10-04 ENCOUNTER — Ambulatory Visit: Admit: 2023-10-04 | Discharge: 2023-10-04 | Payer: BLUE CROSS/BLUE SHIELD

## 2023-10-04 DIAGNOSIS — Z136 Encounter for screening for cardiovascular disorders: Principal | ICD-10-CM

## 2023-10-04 NOTE — Patient Instructions
 Thank you for visiting our office today.    We would like to make the following medication adjustments:  NONE       Otherwise continue the same medications as you have been doing.          We will be pursuing the following tests after your appointment today:       Orders Placed This Encounter    ECG 12-LEAD     Support stockings  Stay hydrated with electrolytes   [facility] exercises    We will plan to see you back in 6 months.  Please call us  in the meantime with any questions or concerns.        Please allow 5-7 business days for our providers to review your results. All normal results will go to MyChart. If you do not have Mychart, it is strongly recommended to get this so you can easily view all your results. If you do not have mychart, we will attempt to call you once with normal lab and testing results. If we cannot reach you by phone with normal results, we will send you a letter.  If you have not heard the results of your testing after one week please give us  a call.       Your Cardiovascular Medicine [place] Team [place], Olam Pierce, Andrea, and [place])  phone number is [phone].

## 2023-10-04 NOTE — Progress Notes
 Date of Service: 10/04/2023    Melissa Hatfield is a 33 y.o. female.       HPI   Melissa Hatfield presents with a longstanding history of orthostasis that dates back to when she was 33 years old.  She reports developing juvenile rheumatoid arthritis when she was 33 years old and she believes that her orthostasis is worse when her arthritis is worse.  She has tried numerous medications for control of her juvenile rheumatoid arthritis including Humira and has been on Enbrel now for 4 months.  Most of her arthritic symptoms are in her right wrist and right knee.  She is getting accustomed to the orthostasis and her symptoms now seem manageable because she stands slowly and waits until she has equilibrated before she starts walking.  She reports no presyncopal or syncopal episodes for the past 2 years.  She is a single mother with 2 young kids and sometimes her orthostasis is also worse during times of stress.  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  There may be a component of postural orthostatic tachycardia but the orthostatic symptoms are her biggest concern.  Palpitations do not appear to be much of a problem. Otherwise, Melissa Hatfield reports no angina, congestive symptoms, sensation of sustained forceful heart pounding, or recent falls, presyncope or syncope.  Her exercise tolerance has been stable but she does not have a regular exercise routine. The patient reports no myalgias, claudication, bleeding abnormalities, or strokelike symptoms.                                       Sitting                              Supine                           Sitting                              Standing  Vitals:    10/04/23 0858 10/04/23 1010 10/04/23 1011 10/04/23 1012   BP: 115/82 119/73 121/81 115/81   BP Source: Arm, Left Upper Arm, Right Upper Arm, Right Upper Arm, Right Upper   Pulse: 87 77 77 85   SpO2: 98%      O2 Device: None (Room air)      PainSc: Zero      Weight: 77.1 kg (170 lb)      Height: 172.7 cm (5' 8)        Body mass index is 25.85 kg/m?SABRA     Past Medical History  Patient Active Problem List    Diagnosis Date Noted    Dizziness 08/17/2023     08/05/2015 - Tilt Table Test at [place] - Tilt table is negative for orthostatic tachycardia, orthostatic hypotension, or neurocardiogenic syncope  07/31/2015 - Holter Monitor at [facility] - Sinus Rhythm, with Sinus Tach and PACs  02/10/2015 - Echocardiogram at [facility] - EF 55-60%, normal global left ventricular systolic function, mild mitral valve regurgitation.      Syncope 08/17/2023     08/05/2015 - Tilt Table Test at [facility] - Tilt table is negative for orthostatic tachycardia, orthostatic hypotension, or neurocardiogenic  syncope.  07/31/2015 - Holter Monitor at [facility] - Sinus Rhythm, with Sinus Tach and PACs  02/10/2015 - Echocardiogram at [facility] - EF 55-60%, normal global left ventricular systolic function, mild mitral valve regurgitation.      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 benefits of TOLAC vs. repeat CD at 19 wk with dr. 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      Supervision of high risk pregnancy, antepartum 05/08/2017     - PNL drawn in [place] - all WNL, RI, A+/antibody neg  - Pap smear with primary in [place]  - NT WNL; NIPS negative  - Had 274 panel carrier screening with last pregnancy and Shulamit was negative for all conditions that were screened for.    -Plan for Tdap 28-30 weeks  -Flu Vaccine: 02/17/20  - elevated glucola, normal 2h GTT        Maternal rheumatoid arthritis complicating pregnancy (CMS-HCC) 04/17/2017     - Has been on Humira in the past and this is the only agent that she does not have side effects with and that is effective against her disease process. She discontinued this prior to pregnancy, however, due to cost. Now she has insurance, however currently she has no significant disease activity and plans to wait until after her delivery to restart the Humira.  -Aware of risk for flare postpartum  - If symptoms develop, she should restart with her Humira antepartum. Previously it was recommended that newborns exposed to in-utero biologics avoid vaccines X 6 months due to concern for immunosuppression, however recent evidence suggests this is not a concern and infants can receive vaccines.   - Primary Rheumatologist is Dr. Deirdre Sorenson [facility] in [place]  [place]).     - Continue PRN use of Tylenol .  Short courses of corticosteroids may also be helpful.    - Pregnancy outcomes in women with well-controlled rheumatoid arthritis are comparable with those in the general population.     - Ultrasound surveillance:  monitor fetal growth Q 4 weeks until delivery.  Initiate weekly surveillance if additional concerns arise that would potentially compromise placental function.  JRA (juvenile rheumatoid arthritis) (CMS-HCC) 02/09/2012         Review of Systems   Constitutional: Negative.   HENT: Negative.     Eyes: Negative.    Cardiovascular: Negative.    Respiratory: Negative.     Endocrine: Negative.    Hematologic/Lymphatic: Negative.    Skin: Negative.    Musculoskeletal: Negative.    Gastrointestinal: Negative.    Genitourinary: Negative.    Neurological:  Positive for dizziness, light-headedness and loss of balance.   Psychiatric/Behavioral: Negative.     Allergic/Immunologic: Negative.      Physical Exam  GENERAL: The patient is well developed, well nourished, resting comfortably and in no distress.   HEENT: No abnormalities of the visible oro-nasopharynx, conjunctiva or sclera are noted.  NECK: There is no jugular venous distension. Carotids are palpable and without bruits. There is no thyroid enlargement.  Chest: Lung fields are clear to auscultation. There are no wheezes or crackles.  CV: There is a regular rhythm. The first and second heart sounds are normal. There are no murmurs, gallops or rubs.  ABD: The abdomen is soft and supple with normal bowel sounds. There is no hepatosplenomegaly, ascites, tenderness, masses or bruits.  Neuro: There are no focal motor defects. Ambulation is normal. Cognitive function appears normal.  Ext: There is no edema or evidence of deep vein thrombosis. Peripheral pulses are satisfactory.    SKIN: There are no rashes and no cellulitis  PSYCH: The patient is calm, rationale and oriented.    Cardiovascular Studies  A twelve-lead ECG obtained on 10/04/2023 reveals normal sinus rhythm with a heart rate of 75 bpm.    Cardiovascular Health Factors  Vitals BP Readings from Last 3 Encounters:   10/04/23 115/81   06/30/20 126/78   05/12/20 129/68     Wt Readings from Last 3 Encounters:   10/04/23 77.1 kg (170 lb)   06/24/21 85.7 kg (189 lb)   06/30/20 86 kg (189 lb 9.6 oz)     BMI Readings from Last 3 Encounters:   10/04/23 25.85 kg/m?   06/24/21 28.74 kg/m?   06/30/20 28.83 kg/m?      Smoking Social History     Tobacco Use   Smoking Status Not on file   Smokeless Tobacco Never      Lipid Profile Cholesterol   Date Value Ref Range Status   11/28/2012 162 125 - 200 mg/dL Final     HDL   Date Value Ref Range Status   11/28/2012 45 (L) > OR = 46 mg/dL Final     LDL   Date Value Ref Range Status   11/28/2012 105 <130 mg/dL (calc) Final     Comment:        Desirable range <100 mg/dL for patients with CHD or  diabetes and <70 mg/dL for diabetic patients with  known heart disease.        Triglycerides   Date Value Ref Range Status   11/28/2012 60 <150 mg/dL Final      Blood Sugar No results found for: HGBA1C  Glucose   Date Value Ref Range Status   05/16/2021 105  Final   05/11/2020 73 70 - 100 MG/DL Final   98/74/7977 97 70 - 100 MG/DL Final          Problems Addressed Today  Encounter Diagnoses   Name Primary?    Screening for heart disease Yes       Assessment and Plan   Ms. Martorelli reports  classic orthostatic symptoms that have been longstanding since age 32.  She has adjusted to them and stands slowly and reports no recent presyncopal or syncopal episodes.  We had a long discussion about management.  This included aggressive hydration with an electrolyte solution, support stockings and an abdominal binder.  Regular conditioning exercises, including yoga and exercise with resistance bands were strongly recommended.  She knows to avoid situations such as rooftops and ladders where lightheadedness could pose danger to herself or others.  I discussed further evaluation which included tilt table testing, event monitoring, an echo Doppler study and exercise test.  She is trying to conserve money and did not want to proceed with any of this testing at the present time.  She would rather conserve her money so she can obtain support stockings, an abdominal binder and afford an appropriate electrolyte solution.  I have asked her to return for follow-up in 6 months time to follow her progress. The total time spent during this interview and exam with preparation and chart review was 60 minutes.         Current Medications (including today's revisions)   acetaminophen  (TYLENOL ) 325 mg tablet Take two tablets by mouth every 4 hours as needed.    adalimumab-adaz (HYRIMOZ) 40 mg/0.8 mL injection syringe Inject 0.8 mL under the skin every 14 days. (Patient not taking: Reported on 10/04/2023)    ARIPiprazole (ABILIFY) 5 mg tablet Take one tablet by mouth at bedtime daily.    etanercept (ENBREL) 25 mg/0.5 mL injection Inject 1 mL under the skin every 7 days.    hydrOXYzine HCL (ATARAX) 25 mg tablet Take one tablet by mouth twice daily as needed for Itching.    pantoprazole DR (PROTONIX) 40 mg tablet Take one tablet by mouth daily. (Patient taking differently: Take one tablet by mouth as Needed.)    venlafaxine XR (EFFEXOR XR) 37.5 mg capsule Take one capsule by mouth daily.
